# Patient Record
Sex: Female | Born: 1960 | Hispanic: Yes | Marital: Married | Smoking: Never smoker
Health system: Southern US, Community
[De-identification: ages and names within clinical notes are randomized; demographics above are authoritative.]

## PROBLEM LIST (undated history)

## (undated) DIAGNOSIS — E785 Hyperlipidemia, unspecified: Secondary | ICD-10-CM

## (undated) DIAGNOSIS — I10 Essential (primary) hypertension: Secondary | ICD-10-CM

## (undated) HISTORY — DX: Essential (primary) hypertension: I10

## (undated) HISTORY — PX: TUBAL LIGATION: SHX77

---

## 2015-07-28 DIAGNOSIS — Z9119 Patient's noncompliance with other medical treatment and regimen: Secondary | ICD-10-CM | POA: Insufficient documentation

## 2015-07-28 DIAGNOSIS — I10 Essential (primary) hypertension: Secondary | ICD-10-CM | POA: Insufficient documentation

## 2015-07-28 DIAGNOSIS — Z91199 Patient's noncompliance with other medical treatment and regimen due to unspecified reason: Secondary | ICD-10-CM | POA: Insufficient documentation

## 2015-07-28 DIAGNOSIS — Z789 Other specified health status: Secondary | ICD-10-CM | POA: Insufficient documentation

## 2016-07-21 DIAGNOSIS — R928 Other abnormal and inconclusive findings on diagnostic imaging of breast: Secondary | ICD-10-CM | POA: Insufficient documentation

## 2016-11-03 ENCOUNTER — Ambulatory Visit (INDEPENDENT_AMBULATORY_CARE_PROVIDER_SITE_OTHER): Payer: Self-pay

## 2016-11-03 ENCOUNTER — Ambulatory Visit (INDEPENDENT_AMBULATORY_CARE_PROVIDER_SITE_OTHER): Payer: Self-pay | Admitting: Family Medicine

## 2016-11-03 VITALS — BP 122/72 | HR 89 | Temp 98.7°F | Resp 17 | Ht 68.5 in | Wt 169.0 lb

## 2016-11-03 DIAGNOSIS — M25562 Pain in left knee: Secondary | ICD-10-CM

## 2016-11-03 MED ORDER — PREDNISONE 20 MG PO TABS: ORAL_TABLET | ORAL | 0 refills | Status: DC

## 2016-11-03 MED ORDER — ACETAMINOPHEN-CODEINE #2 300-15 MG PO TABS: 1.0000 | ORAL_TABLET | Freq: Four times a day (QID) | ORAL | 0 refills | Status: DC | PRN

## 2016-11-03 MED ORDER — ACETAMINOPHEN-CODEINE #2 300-15 MG PO TABS
1.0000 | ORAL_TABLET | Freq: Four times a day (QID) | ORAL | 0 refills | Status: DC | PRN
Start: 2016-11-03 — End: 2017-12-04

## 2016-11-03 NOTE — Progress Notes (Signed)
Patient ID: Erin Boyle, female    DOB: January 05, 1961, 55 y.o.   MRN: [id number]  PCP: Pcp Not In System  Chief Complaint  Patient presents with  . Knee Pain    left 2 weeks pain     Subjective:   HPI 55 year old female presents for evaluation of knee pain, left time 2 weeks. She was walking and twisted her left knee. Knee has been painful for two weeks but yesterday she reports new onset swelling. Used Bengay as topical analgesics with minimal relief. Pain characterized as painful burning sensation. Pain is worst top of her knee and medial posterior region of knee. When standing bearing full weight,  the pain radiates down the front of her leg. Denies any prior knee injury or fall.  Social History   Social History  . Marital status: Married    Spouse name: N/A  . Number of children: N/A  . Years of education: N/A   Occupational History  . Not on file.   Social History Main Topics  . Smoking status: Never Smoker  . Smokeless tobacco: Never Used  . Alcohol use No  . Drug use: No  . Sexual activity: No   Other Topics Concern  . Not on file   Social History Narrative  . No narrative on file   No family history on file.  Review of Systems See HPI  There are no active problems to display for this patient.  Prior to Admission medications   Medication Sig Start Date End Date Taking? Authorizing Provider  lisinopril-hydrochlorothiazide (PRINZIDE,ZESTORETIC) 20-12.5 MG tablet Take 1 tablet by mouth daily.   Yes Historical Provider, MD    Objective:  Physical Exam  Constitutional: She is oriented to person, place, and time. She appears well-developed and well-nourished.  HENT:  Head: Normocephalic and atraumatic.  Eyes: Conjunctivae are normal. Pupils are equal, round, and reactive to light.  Neck: Normal range of motion. Neck supple.  Cardiovascular: Normal rate, regular rhythm, normal heart sounds and intact distal pulses.   Pulmonary/Chest: Effort normal and  breath sounds normal.  Musculoskeletal: She exhibits edema and tenderness.  Left knee. Limited flexion and extension. Anterior knee edematous without palpable effusion.  Neurological: She is alert and oriented to person, place, and time.  Skin: Skin is warm and dry.  Psychiatric: She has a normal mood and affect. Her behavior is normal. Judgment and thought content normal.      Dg Knee Complete 4 Views Left  Result Date: 11/03/2016 CLINICAL DATA:  Pain following twisting injury 2 weeks prior EXAM: LEFT KNEE - COMPLETE 4+ VIEW COMPARISON:  None. FINDINGS: Frontal, lateral, bilateral oblique views were obtained. There is mild soft tissue swelling. There is no fracture or dislocation. No joint effusion. Joint spaces appear unremarkable. No erosive change. IMPRESSION: Mild soft tissue swelling. No fracture or joint effusion. No appreciable arthropathy. Electronically Signed   By: Bretta BangWilliam  Woodruff III M.D.   On: 11/03/2016 12:09   Vitals:   11/03/16 1120  BP: 122/72  Pulse: 89  Resp: 17  Temp: 98.7 F (37.1 C)   Assessment & Plan:  1. Acute pain of left knee - DG Knee Complete 4 Views Left  Pain likely related to knee strain. X-ray confirmed absence of effusion or fracture.  Plan: -Take Prednisone 20 mg,  in mornings with breakfast as follows:  Take 3 pills for 3 days, Take 2 pills for 3 days, and Take 1 pill for 3 days.  Complete all medication. -  Acetaminophen (Tylenol) Codeine 300-15 mg, take 1 tablet every 6 hours for severe pain. -You may apply cold or warm packs to knee for pain as needed. -Return if pain worsens or does not improve.   Godfrey PickKimberly S. Tiburcio PeaHarris, MSN, FNP-C [facility]

## 2016-11-03 NOTE — Patient Instructions (Addendum)
Tome Prednisone 20 mg, en las maanas con el desayuno de la siguiente [place]: tome 3 pldoras durante [address] [place], tome 2 pldoras durante 3 [address]  [address] y tome 1 pldora durante 3 [address]  [address]. Completa todos los medicamentos. Tylenol con codena puede tomar 1 tableta cada 6 horas segn sea necesario para el dolor de rodilla. Puede aplicar compresas fras o calientes a la rodilla para aliviar el dolor segn sea necesario. Regrese si el dolor empeora o no mejora.    Take Prednisone 20 mg,  in mornings with breakfast as follows:  Take 3 pills for 3 days, Take 2 pills for 3 days, and Take 1 pill for 3 days.  Complete all medication.  Tylenol with Codeine may take 1 tablet every 6 hours as needed for knee pain.  You may apply cold or warm packs to knee for pain as needed.  Return if pain worsens or does not improve.  IF you received an x-ray today, you will receive an invoice from [facility]. Please contact [facility] at [phone] with questions or concerns regarding your invoice.   IF you received labwork today, you will receive an invoice from [facility]. Please contact [facility] at [phone] with questions or concerns regarding your invoice.   Our billing staff will not be able to assist you with questions regarding bills from these companies.  You will be contacted with the lab results as soon as they are available. The fastest way to get your results is to activate your My Chart account. Instructions are located on the last page of this paperwork. If you have not heard from us regarding the results in 2 weeks, please contact this office.     Knee Pain Knee pain is a very common symptom and can have many causes. Knee pain often goes away when you follow your health care provider's instructions for relieving pain and discomfort at home. However, knee pain can develop into a condition that needs treatment. Some conditions may include:  Arthritis caused by wear  and tear (osteoarthritis).  Arthritis caused by swelling and irritation (rheumatoid arthritis or gout).  A cyst or growth in your knee.  An infection in your knee joint.  An injury that will not heal.  Damage, swelling, or irritation of the tissues that support your knee (torn ligaments or tendinitis). If your knee pain continues, additional tests may be ordered to diagnose your condition. Tests may include X-rays or other imaging studies of your knee. You may also need to have fluid removed from your knee. Treatment for ongoing knee pain depends on the cause, but treatment may include:  Medicines to relieve pain or swelling.  Steroid injections in your knee.  Physical therapy.  Surgery. HOME CARE INSTRUCTIONS  Take medicines only as directed by your health care provider.  Rest your knee and keep it raised (elevated) while you are resting.  Do not do things that cause or worsen pain.  Avoid high-impact activities or exercises, such as running, jumping rope, or doing jumping jacks.  Apply ice to the knee area:  Put ice in a plastic bag.  Place a towel between your skin and the bag.  Leave the ice on for 20 minutes, 2-3 times a day.  Ask your health care provider if you should wear an elastic knee support.  Keep a pillow under your knee when you sleep.  Lose weight if you are overweight. Extra weight can put pressure on your knee.  Do not use any tobacco products,  including cigarettes, chewing tobacco, or electronic cigarettes. If you need help quitting, ask your health care provider. Smoking may slow the healing of any bone and joint problems that you may have. SEEK MEDICAL CARE IF:  Your knee pain continues, changes, or gets worse.  You have a fever along with knee pain.  Your knee buckles or locks up.  Your knee becomes more swollen. SEEK IMMEDIATE MEDICAL CARE IF:   Your knee joint feels hot to the touch.  You have chest pain or trouble breathing. This  information is not intended to replace advice given to you by your health care provider. Make sure you discuss any questions you have with your health care provider. Document Released: 09/07/2007 Document Revised: 12/01/2014 Document Reviewed: 06/26/2014 Elsevier Interactive Patient Education  2017 [employer]

## 2017-12-04 ENCOUNTER — Encounter: Payer: Self-pay | Admitting: Urgent Care

## 2017-12-04 ENCOUNTER — Ambulatory Visit (INDEPENDENT_AMBULATORY_CARE_PROVIDER_SITE_OTHER): Payer: Self-pay | Admitting: Urgent Care

## 2017-12-04 VITALS — BP 161/90 | HR 96 | Temp 98.4°F | Resp 18 | Ht 64.5 in | Wt 170.0 lb

## 2017-12-04 DIAGNOSIS — I10 Essential (primary) hypertension: Secondary | ICD-10-CM

## 2017-12-04 DIAGNOSIS — R062 Wheezing: Secondary | ICD-10-CM

## 2017-12-04 DIAGNOSIS — R0789 Other chest pain: Secondary | ICD-10-CM

## 2017-12-04 DIAGNOSIS — R05 Cough: Secondary | ICD-10-CM

## 2017-12-04 DIAGNOSIS — R0602 Shortness of breath: Secondary | ICD-10-CM

## 2017-12-04 DIAGNOSIS — R059 Cough, unspecified: Secondary | ICD-10-CM

## 2017-12-04 DIAGNOSIS — R030 Elevated blood-pressure reading, without diagnosis of hypertension: Secondary | ICD-10-CM

## 2017-12-04 MED ORDER — HYDROCHLOROTHIAZIDE 25 MG PO TABS: 25.0000 mg | ORAL_TABLET | Freq: Every day | ORAL | 3 refills | Status: DC

## 2017-12-04 MED ORDER — AMLODIPINE BESYLATE 5 MG PO TABS: 5.0000 mg | ORAL_TABLET | Freq: Every day | ORAL | 3 refills | Status: DC

## 2017-12-04 MED ORDER — BENZONATATE 100 MG PO CAPS: 100.0000 mg | ORAL_CAPSULE | Freq: Three times a day (TID) | ORAL | 0 refills | Status: DC | PRN

## 2017-12-04 MED ORDER — HYDROCODONE-HOMATROPINE 5-1.5 MG/5ML PO SYRP: 5.0000 mL | ORAL_SOLUTION | Freq: Every evening | ORAL | 0 refills | Status: DC | PRN

## 2017-12-04 NOTE — Patient Instructions (Addendum)
Para el dolor de garganta intente usar un t de miel. Use 3 cucharaditas de miel con jugo exprimido de [employer]. Coloque las piezas de [place] en 1/2 - 1 taza de agua y caliente sobre la estufa. Luego mezcle los ingredientes y repita cada 4 horas.     Tos en los adultos Cough, Adult La tos es un reflejo que despeja la garganta y las vas respiratorias. Toser ayuda a curar y [employer]. Es normal toser a Occupational psychologist, pero si la tos aparece junto con otros sntomas o si dura [employer], puede indicar una enfermedad que necesita [place]. La tos puede durar solo 2 o 3semanas (aguda) o ms de 8semanas (crnica). Cules son las causas? Por lo general, las causas de la tos son las siguientes:  Aspirar sustancias que [employer].  Una infeccin respiratoria de origen viral o bacteriano.  Alergias.  Asma.  Goteo posnasal.  Fumar.  cido que vuelve del estmago hacia el esfago (reflujo gastroesofgico ).  Ciertos medicamentos.  Enfermedades pulmonares crnicas, incluida la EPOC (o rara vez, cncer de pulmn).  Otras afecciones, por ejemplo, insuficiencia cardaca.  Siga estas indicaciones en su casa: Est atento a cualquier cambio en los sntomas. Tome estas medidas para Paramedic las molestias:  Tome los medicamentos solamente como se lo haya indicado el mdico. ? Si le recetaron un antibitico, tmelo como se lo haya indicado el mdico. No deje de tomar los antibiticos aunque comience a Actor. ? Hable con el mdico antes de tomar un medicamento para la tos (antitusivo).  Beba suficiente lquido para Photographer orina clara o de color amarillo plido.  Si el aire est seco, use un vaporizador o humidificador de niebla fra en la habitacin o en la casa para ayudar a aflojar las secreciones.  Evite todo lo que le provoque tos en el trabajo o en su casa.  Si la tos empeora por la noche, pruebe a dormir en posicin semierguida.  Evite el  humo del cigarrillo. Si fuma, deje de hacerlo. Si necesita ayuda para dejar de fumar, consulte al mdico.  Evite la cafena.  Evite el alcohol.  Descanse todo lo que sea necesario.  Comunquese con un mdico si:  Aparecen nuevos sntomas.  Tose y escupe pus.  La tos no mejora despus de 2 o 3semanas o empeora.  No puede controlar la tos con antitusivos y no puede dormir debido a Secretary/administrator.  Siente un dolor que empeora o que no puede controlar con medicamentos.  Tiene fiebre.  Baja de peso sin causa aparente.  Tiene transpiracin nocturna. Solicite ayuda de inmediato si:  Tose y [employer].  Tiene dificultad para respirar.  El Hershey Company late Haring rpido. Esta informacin no tiene Theme park manager el consejo del mdico. Asegrese de hacerle al mdico cualquier pregunta que tenga. Document Released: 06/18/2011 Document Revised: 02/12/2017 Document Reviewed: 01/17/2015 Elsevier Interactive Patient Education  2018 [employer].      Hipertensin Hypertension La hipertensin, conocida comnmente como presin arterial alta, se produce cuando la sangre bombea en las arterias con mucha fuerza. Las arterias son los vasos sanguneos que transportan la sangre desde el corazn al resto del cuerpo. La hipertensin hace que el corazn haga ms esfuerzo para Insurance account manager y Sears Holdings Corporation que las arterias se Armed forces training and education officer o Multimedia programmer. La hipertensin no tratada o no controlada puede causar infarto de miocardio, accidentes cerebrovasculares, enfermedad renal y otros problemas. Una lectura de la presin arterial consiste de un nmero ms alto  sobre un nmero ms bajo. En condiciones ideales, la presin arterial debe estar por debajo de 120/80. El primer nmero ("superior") es la presin sistlica. Es la medida de la presin de las arterias cuando el corazn late. El segundo nmero ("inferior") es la presin diastlica. Es la medida de la presin en las arterias cuando el corazn se  relaja. Cules son las causas? Se desconoce la causa de esta afeccin. Qu incrementa el riesgo? Algunos factores de riesgo de hipertensin estn bajo su control. Otros no. Factores que puede Omnicommodificar  Fumar.  Tener diabetes mellitus tipo 2, colesterol alto, o ambos.  No hacer la cantidad suficiente de actividad fsica o ejercicio.  Tener sobrepeso.  Consumir mucha grasa, azcar, caloras o sal (sodio) en su dieta.  Beber alcohol en exceso. Factores que son difciles o imposibles de modificar  Tener enfermedad renal crnica.  Tener antecedentes familiares de presin arterial alta.  La edad. Los riesgos aumentan con la edad.  La raza. El riesgo es mayor para las Statisticianpersonas afroamericanas.  El sexo. Antes de los 45aos, los hombres corren ms Goodyear Tireriesgo que las mujeres. Despus de los 65aos, las mujeres corren ms Lexmark Internationalriesgo que los hombres.  Tener apnea obstructiva del sueo.  El estrs. Cules son los signos o los sntomas? La presin arterial extremadamente alta (crisis hipertensiva) puede provocar:  Dolor de Turkmenistancabeza.  Ansiedad.  Falta de aire.  Hemorragia nasal.  Nuseas y vmitos.  Dolor de pecho intenso.  Una crisis de movimientos que no puede controlar (convulsiones).  Cmo se diagnostica? Esta afeccin se diagnostica midiendo su presin arterial mientras se encuentra sentado, con el brazo apoyado sobre una superficie. El brazalete del tensimetro debe colocarse directamente sobre la piel de la parte superior del brazo y al nivel de su corazn. Debe medirla al [facility] Health Plainviewmenos dos veces en el mismo brazo. Determinadas condiciones pueden causar una diferencia de presin arterial entre el brazo izquierdo y Aeronautical engineerel derecho. Ciertos factores pueden provocar que las lecturas de la presin arterial sean inferiores o superiores a lo normal (elevadas) por un perodo corto de tiempo:  Si su presin arterial es ms alta cuando se encuentra en el consultorio del mdico que cuando la mide  en su hogar, se denomina "hipertensin de bata blanca". La Harley-Davidsonmayora de las personas que tienen esta afeccin no deben ser Engelhard Corporationmedicadas.  Si su presin arterial es ms alta en el hogar que cuando se encuentra en el consultorio del mdico, se denomina "hipertensin enmascarada". La Harley-Davidsonmayora de las personas que tienen esta afeccin deben ser medicadas para Chief Operating Officercontrolar la presin arterial.  Si tiene una lecturas de presin arterial alta durante una visita o si tiene presin arterial normal con otros factores de riesgo:  Es posible que se le pida que regrese Bankerotro da para volver a Chief Operating Officercontrolar su presin arterial.  Se le puede pedir que se controle la presin arterial en su casa durante 1 semana o ms.  Si se le diagnostica hipertensin, es posible que se le realicen otros anlisis de sangre o estudios de diagnstico por imgenes para ayudar a su mdico a comprender su riesgo general de tener otras afecciones. Cmo se trata? Esta afeccin se trata haciendo cambios saludables en el estilo de vida, tales como ingerir alimentos saludables, realizar ms ejercicio y reducir el consumo de alcohol. El mdico puede recetarle medicamentos si los cambios en el estilo de vida no son suficientes para Museum/gallery curatorlograr controlar la presin arterial y si:  Su presin arterial sistlica est por encima de 130.  Su presin arterial diastlica est por encima de 80.  La presin arterial deseada puede variar en funcin de las enfermedades, la edad y otros factores personales. Siga estas instrucciones en su casa: Comida y bebida  Siga una dieta con alto contenido de fibras y [place], y con bajo contenido de sodio, International aid/development worker agregada y Neurosurgeon. Un ejemplo de plan alimenticio es la dieta DASH (Dietary Approaches to Stop Hypertension, Mtodos alimenticios para detener la hipertensin). Para alimentarse de esta manera: ? Coma mucha fruta y verdura fresca. Trate de que la mitad del plato de cada comida sea de frutas y verduras. ? Coma cereales  integrales, como pasta integral, arroz integral y pan integral. Llene aproximadamente un cuarto del plato con cereales integrales. ? Coma y beba productos lcteos con bajo contenido de grasa, como leche descremada o yogur bajo en grasas. ? Evite la ingesta de cortes de carne grasa, carne procesada o curada, y carne de ave con piel. Llene aproximadamente un cuarto del plato con protenas magras, como pescado, pollo sin piel, frijoles, huevos y tofu. ? Evite ingerir alimentos prehechos o procesados. En general, estos tienen mayor cantidad de sodio, azcar agregada y Steffanie Rainwater.  Reduzca su ingesta diaria de sodio. La mayora de las personas que tienen hipertensin deben comer menos de 1500 mg de sodio por [employer] Worldwide.  Limite el consumo de alcohol a no ms de 1 medida por da si es mujer y no est Orthoptist y a 2 medidas por da si es hombre. Una medida equivale a 12onzas de cerveza, 5onzas de vino o 1onzas de bebidas alcohlicas de alta graduacin. Estilo de vida  Trabaje con su mdico para mantener un peso saludable o Curator. Pregntele cual es su peso recomendado.  Realice al menos 30 minutos de ejercicio que haga que se acelere su corazn (ejercicio Magazine features editor) la DIRECTV de la [place]. Estas actividades pueden incluir caminar, nadar o andar en bicicleta.  Incluya ejercicios para fortalecer sus msculos (ejercicios de resistencia), como pilates o levantamiento de pesas, como parte de su rutina semanal de ejercicios. Intente realizar de este tipo de ejercicios al Kellogg a la [place].  No consuma ningn producto que contenga nicotina o tabaco, como cigarrillos y Administrator, Civil Service. Si necesita ayuda para dejar de fumar, consulte al mdico.  Contrlese la presin arterial en su casa segn las indicaciones del mdico.  Concurra a todas las visitas de control como se lo haya indicado el mdico. Esto es importante. Medicamentos  Baxter International de venta libre y  los recetados solamente como se lo haya indicado el mdico. Siga cuidadosamente las indicaciones. Los medicamentos para la presin arterial deben tomarse segn las indicaciones.  No omita las dosis de medicamentos para la presin arterial. Si lo hace, estar en riesgo de tener problemas y puede hacer que los medicamentos sean menos eficaces.  Pregntele a su mdico a qu efectos secundarios o reacciones a los Museum/gallery curator. Comunquese con un mdico si:  Piensa que tiene una reaccin a un medicamento que est tomando.  Tiene dolores de cabeza frecuentes (recurrentes).  Siente mareos.  Tiene hinchazn en los tobillos.  Tiene problemas de visin. Solicite ayuda de inmediato si:  Siente un dolor de cabeza intenso o confusin.  Siente debilidad inusual o adormecimiento.  Siente que va a desmayarse.  Siente un dolor intenso en el pecho o el abdomen.  Vomita repetidas veces.  Tiene dificultad para respirar. Resumen  La hipertensin se produce cuando la Sara Lee  bombea en las arterias con mucha fuerza. Si esta afeccin no se controla, podra correr riesgo de tener complicaciones graves.  La presin arterial deseada puede variar en funcin de las enfermedades, la edad y otros factores personales. Para la [employer] Resources, una presin arterial normal es menor que 120/80.  La hipertensin se trata con cambios en el estilo de vida, medicamentos o una combinacin de [place]. Los Danaher Corporation estilo de vida incluyen prdida de peso, ingerir alimentos sanos, seguir una dieta baja en sodio, hacer ms ejercicio y Glass blower/designer consumo de alcohol. Esta informacin no tiene Theme park manager el consejo del mdico. Asegrese de hacerle al mdico cualquier pregunta que tenga. Document Released: 11/10/2005 Document Revised: 10/22/2016 Document Reviewed: 10/22/2016 Elsevier Interactive Patient Education  2018 [employer]vin[employer].     IF you received an x-ray today, you will  receive an invoice from [facility]. Please contact [facility] at [phone] with questions or concerns regarding your invoice.   IF you received labwork today, you will receive an invoice from [facility]. Please contact [facility] at [phone] with questions or concerns regarding your invoice.   Our billing staff will not be able to assist you with questions regarding bills from these companies.  You will be contacted with the lab results as soon as they are available. The fastest way to get your results is to activate your My Chart account. Instructions are located on the last page of this paperwork. If you have not heard from [place] regarding the results in 2 weeks, please contact this office.

## 2017-12-04 NOTE — Progress Notes (Signed)
   MRN: [id number] DOB: 1961-07-31  Subjective:   Erin Boyle is a 57 y.o. female presenting for follow up on Hypertension. Currently managed with lis-HCTZ. Patient is checking blood pressure at home, generally 130's systolic when she takes her BP medications. Currently she is out of her medications. Avoids salt in diet, is not exercising. Reports 8 day history of productive cough that elicits chest pain, shob, wheezing, sore throat. However, she admits that she would like to change her lisinopril because her cough has been more chronic in nature. Denies dizziness, chronic headache, heart racing, palpitations, nausea, vomiting, abdominal pain, hematuria, lower leg swelling. Denies smoking cigarettes or drinking alcohol.   Erin Boyle has a current medication list which includes the following prescription(s): lisinopril-hydrochlorothiazide. Also has No Known Allergies.  Erin Boyle  has no past medical history on file. Also  has no past surgical history on file.  Objective:   Vitals: BP (!) 161/90   Pulse 96   Temp 98.4 F (36.9 C) (Oral)   Resp 18   Ht 5' 4.5" (1.638 m)   Wt 170 lb (77.1 kg)   SpO2 100%   BMI 28.73 kg/m   BP Readings from Last 3 Encounters:  12/04/17 (!) 161/90  11/03/16 122/72    Physical Exam  Constitutional: She is oriented to person, place, and time. She appears well-developed and well-nourished.  HENT:  TM's intact bilaterally, no effusions or erythema. Nasal turbinates pink, moist, nasal passages patent. No sinus tenderness. Oropharynx clear, mucous membranes moist.  Eyes: Right eye exhibits no discharge. Left eye exhibits no discharge.  Neck: Normal range of motion. Neck supple. No thyromegaly present.  Cardiovascular: Normal rate, regular rhythm and intact distal pulses. Exam reveals no gallop and no friction rub.  No murmur heard. Pulmonary/Chest: No respiratory distress. She has no wheezes. She has no rales.  Abdominal: Soft. Bowel sounds are normal. She  exhibits no distension and no mass. There is no tenderness.  Musculoskeletal: She exhibits no edema.  Lymphadenopathy:    She has no cervical adenopathy.  Neurological: She is alert and oriented to person, place, and time.  Skin: Skin is warm and dry. No rash noted. No erythema. No pallor.  Psychiatric: She has a normal mood and affect.   Assessment and Plan :   Essential hypertension - Plan: Comprehensive metabolic panel  Elevated blood pressure reading  Cough  Atypical chest pain  Shortness of breath  Wheezing   Will stop lisinopril. Maintain HCTZ but increase this to 25mg . Add amlodipine 5mg . Counseled patient on potential for adverse effects with medications prescribed today, patient verbalized understanding. Labs pending. Will start supportive care for viral URI. Return-to-clinic precautions discussed, patient verbalized understanding.   Wallis BambergMario Chauntae Hults, PA-C [facility] [phone] 12/04/2017  2:39 PM

## 2017-12-05 LAB — COMPREHENSIVE METABOLIC PANEL
ALT: 16 IU/L (ref 0–32)
AST: 16 IU/L (ref 0–40)
Albumin/Globulin Ratio: 1.2 (ref 1.2–2.2)
Albumin: 3.7 g/dL (ref 3.5–5.5)
Alkaline Phosphatase: 109 IU/L (ref 39–117)
BUN/Creatinine Ratio: 29 — ABNORMAL HIGH (ref 9–23)
BUN: 16 mg/dL (ref 6–24)
Bilirubin Total: <0.2 mg/dL (ref 0.0–1.2)
CO2: 22 mmol/L (ref 20–29)
Calcium: 8.9 mg/dL (ref 8.7–10.2)
Chloride: 103 mmol/L (ref 96–106)
Creatinine, Ser: 0.55 mg/dL — ABNORMAL LOW (ref 0.57–1.00)
GFR calc Af Amer: 122 mL/min/{1.73_m2} (ref >59)
GFR calc non Af Amer: 106 mL/min/{1.73_m2} (ref >59)
Globulin, Total: 3.2 g/dL (ref 1.5–4.5)
Glucose: 111 mg/dL — ABNORMAL HIGH (ref 65–99)
Potassium: 4.1 mmol/L (ref 3.5–5.2)
Sodium: 142 mmol/L (ref 134–144)
Total Protein: 6.9 g/dL (ref 6.0–8.5)

## 2017-12-10 ENCOUNTER — Encounter: Payer: Self-pay | Admitting: Urgent Care

## 2017-12-30 ENCOUNTER — Telehealth: Payer: Self-pay | Admitting: Urgent Care

## 2017-12-30 NOTE — Telephone Encounter (Signed)
Called and spoke with pt to remind them of their apt tomorrow. Reminded pt of 15 minutes early, building number and 10 minute late policy. °

## 2017-12-31 ENCOUNTER — Ambulatory Visit (INDEPENDENT_AMBULATORY_CARE_PROVIDER_SITE_OTHER): Payer: Self-pay | Admitting: Urgent Care

## 2017-12-31 VITALS — BP 133/83 | HR 92 | Temp 98.3°F | Resp 16 | Ht 64.5 in | Wt 171.8 lb

## 2017-12-31 DIAGNOSIS — I10 Essential (primary) hypertension: Secondary | ICD-10-CM

## 2017-12-31 DIAGNOSIS — R05 Cough: Secondary | ICD-10-CM

## 2017-12-31 DIAGNOSIS — R030 Elevated blood-pressure reading, without diagnosis of hypertension: Secondary | ICD-10-CM

## 2017-12-31 DIAGNOSIS — R059 Cough, unspecified: Secondary | ICD-10-CM

## 2017-12-31 DIAGNOSIS — R0789 Other chest pain: Secondary | ICD-10-CM

## 2017-12-31 MED ORDER — BENZONATATE 100 MG PO CAPS: 100.0000 mg | ORAL_CAPSULE | Freq: Three times a day (TID) | ORAL | 0 refills | Status: DC | PRN

## 2017-12-31 MED ORDER — HYDROCODONE-HOMATROPINE 5-1.5 MG/5ML PO SYRP: 5.0000 mL | ORAL_SOLUTION | Freq: Every evening | ORAL | 0 refills | Status: DC | PRN

## 2017-12-31 NOTE — Patient Instructions (Addendum)
Tos en los adultos Cough, Adult La tos es un reflejo que despeja la garganta y las vas respiratorias. Toser ayuda a curar y [employer]. Es normal toser a Occupational psychologist, pero si la tos aparece junto con otros sntomas o si dura [employer], puede indicar una enfermedad que necesita [place]. La tos puede durar solo 2 o 3semanas (aguda) o ms de 8semanas (crnica). Cules son las causas? Por lo general, las causas de la tos son las siguientes:  Aspirar sustancias que [employer].  Una infeccin respiratoria de origen viral o bacteriano.  Alergias.  Asma.  Goteo posnasal.  Fumar.  cido que vuelve del estmago hacia el esfago (reflujo gastroesofgico ).  Ciertos medicamentos.  Enfermedades pulmonares crnicas, incluida la EPOC (o rara vez, cncer de pulmn).  Otras afecciones, por ejemplo, insuficiencia cardaca.  Siga estas indicaciones en su casa: Est atento a cualquier cambio en los sntomas. Tome estas medidas para Paramedic las molestias:  Tome los medicamentos solamente como se lo haya indicado el mdico. ? Si le recetaron un antibitico, tmelo como se lo haya indicado el mdico. No deje de tomar los antibiticos aunque comience a Actor. ? Hable con el mdico antes de tomar un medicamento para la tos (antitusivo).  Beba suficiente lquido para Photographer orina clara o de color amarillo plido.  Si el aire est seco, use un vaporizador o humidificador de niebla fra en la habitacin o en la casa para ayudar a aflojar las secreciones.  Evite todo lo que le provoque tos en el trabajo o en su casa.  Si la tos empeora por la noche, pruebe a dormir en posicin semierguida.  Evite el humo del cigarrillo. Si fuma, deje de hacerlo. Si necesita ayuda para dejar de fumar, consulte al mdico.  Evite la cafena.  Evite el alcohol.  Descanse todo lo que sea necesario.  Comunquese con un mdico si:  Aparecen nuevos sntomas.  Tose y escupe  pus.  La tos no mejora despus de 2 o 3semanas o empeora.  No puede controlar la tos con antitusivos y no puede dormir debido a Secretary/administrator.  Siente un dolor que empeora o que no puede controlar con medicamentos.  Tiene fiebre.  Baja de peso sin causa aparente.  Tiene transpiracin nocturna. Solicite ayuda de inmediato si:  Tose y [employer].  Tiene dificultad para respirar.  [employer] late Odanah rpido. Esta informacin no tiene Theme park manager el consejo del mdico. Asegrese de hacerle al mdico cualquier pregunta que tenga. Document Released: 06/18/2011 Document Revised: 02/12/2017 Document Reviewed: 01/17/2015 Elsevier Interactive Patient Education  2018 Elsevier Inc.     Cmo controlar su hipertensin Managing Your Hypertension La hipertensin se denomina usualmente presin arterial alta. Ocurre cuando la sangre presiona contra las paredes de las arterias con demasiada fuerza. Las arterias son los vasos sanguneos que transportan la sangre desde el corazn hacia todas las partes del cuerpo. La hipertensin hace que el corazn haga ms esfuerzo para Insurance account manager y [employer] que las arterias se Armed forces training and education officer o Multimedia programmer. La hipertensin no tratada o no controlada puede causar infarto de miocardio, accidente cerebrovascular, enfermedad renal y otros problemas. Qu son las Merchandiser, retail de presin arterial? Una lectura de la presin arterial consiste de un nmero ms alto sobre un nmero ms bajo. En condiciones ideales, la presin arterial debe estar por debajo de 120/80. El primer nmero ("superior") es la presin sistlica. Es la medida de la presin de las arterias cuando el corazn late.  El segundo nmero ("inferior") es la presin diastlica. Es la medida de la presin en las arterias cuando el corazn se relaja. Qu significa mi lectura de presin arterial? La presin arterial se clasifica en cuatro etapas. Sobre la base de la lectura de su presin arterial, el mdico  puede usar las siguientes etapas para determinar si necesita tratamiento y de qu tipo. La presin sistlica y la presin diastlica se miden en una unidad llamada mm Hg. Normal  Presin sistlica: por debajo de 120.  Presin diastlica: por debajo de 80. Elevada  Presin sistlica: 120-129.  Presin diastlica: por debajo de 80. Etapa 1 de hipertensin  Presin sistlica: 130-139.  Presin diastlica: 80-89. Etapa 2 de hipertensin  Presin sistlica: 140 o ms.  Presin diastlica: 90 o ms. Cules son los riesgos para la salud asociados con la hipertensin? Controlar la hipertensin es una responsabilidad importante. La hipertensin no controlada puede causar:  Infarto de miocardio.  Accidente cerebrovascular.  Debilitamiento de los vasos sanguneos (aneurisma).  Insuficiencia cardaca.  Dao renal.  Dao ocular.  Sndrome metablico.  Problemas de memoria y concentracin.  Qu cambios puedo hacer para controlar mi hipertensin? La hipertensin se puede controlar haciendo Danaher Corporationcambios en el estilo de vida y, posiblemente, tomando medicamentos. Su mdico le ayudar a crear un plan para bajar la presin arterial al rango normal. Comida y bebida  Siga una dieta con alto contenido de fibras y [place], y con bajo contenido de sal (sodio), azcar agregada y Rosalin Hawkinggrasas. Un ejemplo de plan alimenticio es la dieta DASH (Dietary Approaches to Stop Hypertension, Mtodos alimenticios para detener la hipertensin). Para alimentarse de esta manera: ? Coma mucha fruta y verdura fresca. Trate de que la mitad del plato de cada comida sea de frutas y verduras. ? Coma cereales integrales, como pasta integral, arroz integral y pan integral. Llene aproximadamente un cuarto del plato con cereales integrales. ? Consuma productos lcteos con bajo contenido de grasa. ? Evite la ingesta de cortes de carne grasa, carne procesada o curada, y carne de ave con piel. Llene aproximadamente un cuarto del  plato con protenas magras, como pescado, pollo sin piel, frijoles, huevos y tofu. ? Evite ingerir alimentos prehechos o procesados. En general, estos tienen mayor cantidad de sodio, azcar agregada y Steffanie Rainwatergrasa.  Reduzca su ingesta diaria de sodio. La mayora de las personas que tienen hipertensin deben comer menos de 1500 mg de sodio por C.H. Robinson Worldwideda.  Limite el consumo de alcohol a no ms de 1 medida por da si es mujer y no est Orthoptistembarazada y a 2 medidas por da si es hombre. Una medida equivale a 12onzas de cerveza, 5onzas de vino o 1onzas de bebidas alcohlicas de alta graduacin. Estilo de vida  Trabaje con su mdico para mantener un peso saludable o Curatorperder peso. Pregntele cual es su peso recomendado.  Realice al menos 30 minutos de ejercicio que haga que se acelere su corazn (ejercicio Magazine features editoraerbico) la DIRECTVmayora de los das de la [place]mana. Estas actividades pueden incluir caminar, nadar o andar en bicicleta.  Incluya ejercicios para fortalecer sus msculos (ejercicios de resistencia), como levantamiento de pesas, como parte de su rutina semanal de ejercicios. Intente realizar 30minutos de este tipo de ejercicios al Kelloggmenos tres das a la [place]mana.  No consuma ningn producto que contenga nicotina o tabaco, como cigarrillos y Administrator, Civil Servicecigarrillos electrnicos. Si necesita ayuda para dejar de fumar, consulte al American Expressmdico.  Controle las enfermedades a largo plazo (crnicas), como el colesterol alto o la diabetes. Control  Contrlese  la presin arterial en su casa segn las indicaciones del mdico. La presin arterial deseada puede variar en funcin de las enfermedades, la edad y otros factores personales.  Contrlese la presin arterial de manera regular, en la frecuencia indicada por su mdico. Trabaje con su mdico  Revise con su mdico todos los medicamentos que toma ya que puede haber efectos secundarios o interacciones.  Hable con su mdico acerca de la dieta, hbitos de ejercicio y otros factores del estilo de  vida que pueden contribuir a la hipertensin.  Consulte a su mdico regularmente. Su mdico puede ayudarle a crear y [place] su plan para controlar la hipertensin. Debo tomar un medicamento para controlar mi presin arterial? El mdico puede recetarle medicamentos si los cambios en el estilo de vida no son suficientes para Museum/gallery curator la presin arterial y si:  Su presin arterial sistlica es de 130 o ms.  Su presin arterial diastlica es de 80 o ms.  Tome los medicamentos solamente como se lo haya indicado el mdico. Siga cuidadosamente las indicaciones. Los medicamentos para la presin arterial deben tomarse segn las indicaciones. Los medicamentos pierden eficacia al omitir las dosis. El hecho de omitir las dosis tambin [place] el riesgo de otros problemas. Comunquese con un mdico si:  Piensa que tiene Runner, broadcasting/film/video a los medicamentos que ha tomado.  Tiene dolores de cabeza frecuentes (recurrentes).  Siente mareos.  Tiene hinchazn en los tobillos.  Tiene problemas de visin. Solicite ayuda de inmediato si:  Siente un dolor de cabeza intenso o confusin.  Siente debilidad inusual, adormecimiento o que Hospital doctor.  Siente un dolor intenso en el pecho o el abdomen.  Vomita repetidas veces.  Tiene dificultad para respirar. Resumen  La hipertensin se produce cuando la sangre bombea en las arterias con mucha fuerza. Si esta afeccin no se controla, podra correr riesgo de tener complicaciones graves.  La presin arterial deseada puede variar en funcin de las enfermedades, la edad y otros factores personales. Para la [employer], una presin arterial normal es menor que 120/80.  La hipertensin se puede controlar mediante cambios en el estilo de vida, tomando medicamentos, o ambas cosas. Los Dana[employer] Corporation estilo de vida incluyen prdida de peso, ingerir alimentos sanos, seguir una dieta baja en sodio, hacer ms ejercicio y Glass blower/designer consumo  de alcohol. Esta informacin no tiene Theme park manager el consejo del mdico. Asegrese de hacerle al mdico cualquier pregunta que tenga. Document Released: 08/04/2012 Document Revised: 10/22/2016 Document Reviewed: 10/22/2016 Elsevier Interactive Patient Education  2018 [employer].     IF you received an x-ray today, you will receive an invoice from [facility]. Please contact [facility] at [phone] with questions or concerns regarding your invoice.   IF you received labwork today, you will receive an invoice from [facility]. Please contact [facility] at [phone] with questions or concerns regarding your invoice.   Our billing staff will not be able to assist you with questions regarding bills from these companies.  You will be contacted with the lab results as soon as they are available. The fastest way to get your results is to activate your My Chart account. Instructions are located on the last page of this paperwork. If you have not heard from [place] regarding the results in 2 weeks, please contact this office.

## 2017-12-31 NOTE — Progress Notes (Signed)
   MRN: [id number] DOB: [birth year]  Subjective:   Erin Boyle is a 57 y.o. female presenting for follow up on Hypertension. At her last OV, she was taken off of lisinopril for a cough. She still has a cough and is better but still elicits mild chest pain. Currently managed with amlodipine, HCTZ. Patient is checking blood pressure at home, generally 140's systolic. She is not actively avoiding salt in her diet. Denies dizziness, shortness of breath, heart racing, palpitations, nausea, vomiting, abdominal pain, hematuria, lower leg swelling. Denies smoking cigarettes or drinking alcohol.   Erin Boyle has a current medication list which includes the following prescription(s): amlodipine and hydrochlorothiazide. Also has No Known Allergies.  Erin Boyle  has a past medical history of Hypertension. Also  has a past surgical history that includes Tubal ligation.  Objective:   Vitals: BP 133/83   Pulse 92   Temp 98.3 F (36.8 C) (Oral)   Resp 16   Ht 5' 4.5" (1.638 m)   Wt 171 lb 12.8 oz (77.9 kg)   SpO2 96%   BMI 29.03 kg/m   BP Readings from Last 3 Encounters:  12/31/17 133/83  12/04/17 (!) 161/90  11/03/16 122/72    Physical Exam  Constitutional: She is oriented to person, place, and time. She appears well-developed and well-nourished.  HENT:  Mouth/Throat: Oropharynx is clear and moist.  Cardiovascular: Normal rate, regular rhythm and intact distal pulses. Exam reveals no gallop and no friction rub.  No murmur heard. Pulmonary/Chest: No respiratory distress. She has no wheezes. She has no rales.  Neurological: She is alert and oriented to person, place, and time.  Skin: Skin is warm and dry.  Psychiatric: She has a normal mood and affect.   Assessment and Plan :   Essential hypertension  Elevated blood pressure reading  Cough  Atypical chest pain  Physical exam findings reassuring. Patient prefers to try cough medications, will hold off on doing chest x-ray. Maintain current  regimen for HTN. Recheck if cough persists, consider x-ray, steroid course and/or antibiotic course if this is the case.  Erin BambergMario Lyrica Mcclarty, PA-C [facility] [phone] 12/31/2017  11:54 AM

## 2018-01-15 ENCOUNTER — Ambulatory Visit: Payer: Self-pay | Admitting: Urgent Care

## 2018-01-16 ENCOUNTER — Other Ambulatory Visit: Payer: Self-pay

## 2018-01-16 ENCOUNTER — Ambulatory Visit (INDEPENDENT_AMBULATORY_CARE_PROVIDER_SITE_OTHER): Payer: Self-pay | Admitting: Urgent Care

## 2018-01-16 ENCOUNTER — Encounter: Payer: Self-pay | Admitting: Urgent Care

## 2018-01-16 VITALS — BP 146/86 | HR 84 | Temp 98.4°F | Resp 18 | Ht 60.39 in | Wt 171.0 lb

## 2018-01-16 DIAGNOSIS — R059 Cough, unspecified: Secondary | ICD-10-CM

## 2018-01-16 DIAGNOSIS — R030 Elevated blood-pressure reading, without diagnosis of hypertension: Secondary | ICD-10-CM

## 2018-01-16 DIAGNOSIS — R0789 Other chest pain: Secondary | ICD-10-CM

## 2018-01-16 DIAGNOSIS — I10 Essential (primary) hypertension: Secondary | ICD-10-CM

## 2018-01-16 DIAGNOSIS — R05 Cough: Secondary | ICD-10-CM

## 2018-01-16 NOTE — Patient Instructions (Signed)
     IF you received an x-ray today, you will receive an invoice from [facility]. Please contact [facility] at [phone] with questions or concerns regarding your invoice.   IF you received labwork today, you will receive an invoice from [facility]. Please contact [facility] at [phone] with questions or concerns regarding your invoice.   Our billing staff will not be able to assist you with questions regarding bills from these companies.  You will be contacted with the lab results as soon as they are available. The fastest way to get your results is to activate your My Chart account. Instructions are located on the last page of this paperwork. If you have not heard from us regarding the results in 2 weeks, please contact this office.     

## 2018-01-16 NOTE — Progress Notes (Signed)
    MRN: [id number] DOB: 1961/10/02  Subjective:   Erin Boyle is a 57 y.o. female presenting for follow up on cough. Reports that cough is improved, chest pain resolved. Denies fever, sore throat, shob, wheezing, n/v, abdominal pain. Denies smoking cigarettes or drinking alcohol.   [name] has a current medication list which includes the following prescription(s): amlodipine, hydrochlorothiazide, benzonatate, and hydrocodone-homatropine. Also has No Known Allergies.  Erin Boyle  has a past medical history of Hypertension. Also  has a past surgical history that includes Tubal ligation.  Objective:   Vitals: BP (!) 146/86 (BP Location: Left Arm, Patient Position: Sitting, Cuff Size: Normal)   Pulse 84   Temp 98.4 F (36.9 C) (Oral)   Resp 18   Ht 5' 0.39" (1.534 m)   Wt 171 lb (77.6 kg)   SpO2 97%   BMI 32.96 kg/m   Physical Exam  Constitutional: She is oriented to person, place, and time. She appears well-developed and well-nourished.  Cardiovascular: Normal rate, regular rhythm and intact distal pulses. Exam reveals no gallop and no friction rub.  No murmur heard. Pulmonary/Chest: No respiratory distress. She has no wheezes. She has no rales.  Neurological: She is alert and oriented to person, place, and time.   Assessment and Plan :   Cough  Atypical chest pain  Essential hypertension  Elevated blood pressure reading  Improved cough, chest pain resolved. Return-to-clinic precautions discussed, patient verbalized understanding. Maintain healthy lifestyle, BP medications. Follow up in 6 months.   Erin BambergMario Mikeya Tomasetti, PA-C [facility] at [facility] [phone] 01/16/2018  2:29 PM

## 2018-12-26 ENCOUNTER — Other Ambulatory Visit: Payer: Self-pay | Admitting: Urgent Care

## 2018-12-27 ENCOUNTER — Other Ambulatory Visit: Payer: Self-pay | Admitting: *Deleted

## 2018-12-27 ENCOUNTER — Telehealth: Payer: Self-pay | Admitting: *Deleted

## 2018-12-27 NOTE — Telephone Encounter (Signed)
Per Spain[place] interpreter patient is aware appointment has been scheduled for Sat. 01/01/2019 @  9:00 am. Pt advised that medication given enough until appointment.

## 2019-01-01 ENCOUNTER — Ambulatory Visit (INDEPENDENT_AMBULATORY_CARE_PROVIDER_SITE_OTHER): Payer: Self-pay | Admitting: Family Medicine

## 2019-01-01 ENCOUNTER — Other Ambulatory Visit: Payer: Self-pay

## 2019-01-01 ENCOUNTER — Encounter: Payer: Self-pay | Admitting: Family Medicine

## 2019-01-01 VITALS — BP 139/79 | HR 83 | Temp 97.7°F | Resp 14 | Ht 60.0 in | Wt 170.0 lb

## 2019-01-01 DIAGNOSIS — Z1211 Encounter for screening for malignant neoplasm of colon: Secondary | ICD-10-CM

## 2019-01-01 DIAGNOSIS — I10 Essential (primary) hypertension: Secondary | ICD-10-CM

## 2019-01-01 DIAGNOSIS — Z6833 Body mass index (BMI) 33.0-33.9, adult: Secondary | ICD-10-CM

## 2019-01-01 DIAGNOSIS — Z23 Encounter for immunization: Secondary | ICD-10-CM

## 2019-01-01 MED ORDER — HYDROCHLOROTHIAZIDE 25 MG PO TABS: 25.0000 mg | ORAL_TABLET | Freq: Every day | ORAL | 1 refills | Status: DC

## 2019-01-01 MED ORDER — AMLODIPINE BESYLATE 5 MG PO TABS: 5.0000 mg | ORAL_TABLET | Freq: Every day | ORAL | 1 refills | Status: DC

## 2019-01-01 NOTE — Progress Notes (Signed)
2/8/20209:24 AM  Erin CunasEsmeris Kuwahara July 08, 1961, 58 y.o. female [id number]  Chief Complaint  Patient presents with  . Establish Care    [facility] need refill on blood pressure medication that i have already pend.    HPI:   Patient is a 58 y.o. female with past medical history significant for HTN  who presents today to establish care  Previous PCP Urban GibsonMani, PA-C Last OV Sept 2019  Overall doing well Reports last pap and mammogram about 5 years ago Denies fhx colon, breast, ovarian cancer Does not have insurance  Lab Results  Component Value Date   CREATININE 0.55 (L) 12/04/2017   Lab Results  Component Value Date   NA 142 12/04/2017   K 4.1 12/04/2017   CL 103 12/04/2017   CO2 22 12/04/2017    Fall Risk  01/01/2019 01/16/2018  Falls in the past year? 0 No  Number falls in past yr: 0 -  Injury with Fall? 0 -  Follow up Falls evaluation completed -     [facility] 01/01/2019 01/16/2018 12/31/2017  Decreased Interest 0 0 0  Down, Depressed, Hopeless 0 0 0  PHQ - 2 Score 0 0 0    No Known Allergies  Prior to Admission medications   Medication Sig Start Date End Date Taking? Authorizing Provider  amLODipine (NORVASC) 5 MG tablet TAKE 1 TABLET BY MOUTH ONCE DAILY 12/27/18  Yes Myles LippsSantiago, Malia Corsi M, MD  hydrochlorothiazide (HYDRODIURIL) 25 MG tablet TAKE 1 TABLET BY MOUTH ONCE DAILY 12/27/18  Yes Myles LippsSantiago, Gabriella Woodhead M, MD    Past Medical History:  Diagnosis Date  . Hypertension     Past Surgical History:  Procedure Laterality Date  . TUBAL LIGATION      Social History   Tobacco Use  . Smoking status: Never Smoker  . Smokeless tobacco: Never Used  Substance Use Topics  . Alcohol use: No    Family History  Problem Relation Age of Onset  . Hypertension Mother   . Heart disease Mother   . Diabetes Sister   . Hyperlipidemia Sister   . Heart disease Sister   . Hypertension Sister   . Heart disease Maternal Aunt     Review of Systems    Constitutional: Negative for chills and fever.  Respiratory: Negative for cough and shortness of breath.   Cardiovascular: Negative for chest pain, palpitations and leg swelling.  Gastrointestinal: Negative for abdominal pain, blood in stool, melena, nausea and vomiting.     OBJECTIVE:  Blood pressure 139/79, pulse 83, temperature 97.7 F (36.5 C), temperature source Oral, resp. rate 14, height 5' (1.524 m), weight 170 lb (77.1 kg), SpO2 97 %. Body mass index is 33.2 kg/m.   Wt Readings from Last 3 Encounters:  01/01/19 170 lb (77.1 kg)  01/16/18 171 lb (77.6 kg)  12/31/17 171 lb 12.8 oz (77.9 kg)    Physical Exam Vitals signs and nursing note reviewed.  Constitutional:      Appearance: She is well-developed.  HENT:     Head: Normocephalic and atraumatic.     Mouth/Throat:     Pharynx: No oropharyngeal exudate.  Eyes:     General: No scleral icterus.    Conjunctiva/sclera: Conjunctivae normal.     Pupils: Pupils are equal, round, and reactive to light.  Neck:     Musculoskeletal: Neck supple.  Cardiovascular:     Rate and Rhythm: Normal rate and regular rhythm.     Heart sounds: Normal heart  sounds. No murmur. No friction rub. No gallop.   Pulmonary:     Effort: Pulmonary effort is normal.     Breath sounds: Normal breath sounds. No wheezing or rales.  Skin:    General: Skin is warm and dry.  Neurological:     Mental Status: She is alert and oriented to person, place, and time.     ASSESSMENT and PLAN  1. Essential hypertension, benign Controlled. Continue current regime.  - amLODipine (NORVASC) 5 MG tablet; Take 1 tablet (5 mg total) by mouth daily. - hydrochlorothiazide (HYDRODIURIL) 25 MG tablet; Take 1 tablet (25 mg total) by mouth daily. - Comprehensive metabolic panel - Lipid Panel  2. Need for prophylactic vaccination and inoculation against influenza - Flu Vaccine QUAD 6+ mos PF IM (Fluarix Quad PF)  3. Colon cancer screening - IFOBT POC (occult  bld, rslt in office); Future  4. BMI 33.0-33.9,adult Discussed importance of healthy diet, regular exercise and healthy weight.     Provided contact info for [facility] program  Return in about 6 months (around 07/02/2019).    Myles Lipps, MD Primary Care at [facility] [address] [place], [place] [address] Ph.  [phone] Fax [phone]

## 2019-01-01 NOTE — Patient Instructions (Addendum)
PROGRAMA PARA SU PAPANICOLAO Y MAMOGRAMA Breast and Cervical Cancer Program for [facility]: 1. [place], Fonnie Mu, Coordinator, [phone] 2. [place] [facility], Orlie Pollen, Cordinator, [phone]    Prevencin de las complicaciones causadas por conductas poco saludables para bajar de peso en los adultos Preventing Complications From Unhealthy Weight Loss Behaviors, Adult Personnel officer y Pharmacologist un peso saludable es importante para su salud general. Es natural querer bajar de peso enseguida, utilizando el mtodo que parezca ms rpido. Sin embargo, Publishing copy de peso de [place] saludable no es un proceso rpido. En su lugar, pngase como meta bajar de peso despacio y de [place]. Qu cambios en el estilo de vida se pueden realizar? Puede hacer ciertos cambios en su estilo de vida que lo ayudarn a Publishing copy de peso de [place] saludable. Entre [employer], se incluyen comer alimentos nutritivos y Radio producer ejercicio con regularidad. Lo que se debe evitar:  Seguir una dieta que prohba clases enteras de alimentos.  Saltearse comidas para Oncologist. Desayunar es particularmente importante.  No comer nada durante perodos prolongados (ayunar).  Limitar las caloras a mucho menos de la cantidad que necesita para Publishing copy de peso o Pharmacologist un peso saludable.  Hacer ejercicio extremadamente intenso de manera compulsiva.  Tomar laxantes para defecar con mayor frecuencia.  Tomar medicamentos para hacer que el cuerpo elimine el exceso de lquido (diurticos).  Consumir una cantidad excesiva de alimentos (atracn compulsivo) y luego provocarse el vmito (purga). Conductas saludables:   Consuma una amplia variedad de alimentos saludables, por ejemplo: ? Frutas y verduras. ? Cereales integrales. ? Protenas magras. ? Productos lcteos descremados.  Beba agua en lugar de bebidas azucaradas.  Planifique comidas saludables, bajas en caloras. Trabaje con un especialista  en nutricin (nutricionista) para elaborar un plan de comidas saludables que sea adecuado para usted y Programme researcher, broadcasting/film/video un programa de ejercicios. ? Incluya diferentes tipos de ejercicios en el programa, por ejemplo, ejercicios de fuerza, aerbicos y de flexibilidad. ? Para Pitney Bowes, haga por lo menos de ejercicio de intensidad moderada por semana. El ejercicio de intensidad moderada podra incluir caminar enrgicamente o andar en bicicleta. ? Para bajar de peso de [place] saludable, haga de ejercicio de intensidad moderada por da.  Encuentre maneras de reducir el estrs, por ejemplo, hacer ejercicio o meditar con regularidad.  Encuentre un pasatiempo u otra actividad que disfrute para distraerse y Facilities manager cuando se sienta estresado o aburrido. Por qu son importantes estos cambios? Hacer estos cambios lo ayudar a Scientist, clinical (histocompatibility and immunogenetics) su salud general. Adems, mantener un peso saludable reduce el riesgo de sufrir ciertas afecciones, por ejemplo:  Enfermedades cardacas.  Colesterol elevado.  Hipertensin arterial.  Diabetes tipo 2.  Accidente cerebrovascular.  Artrosis.  Osteoporosis.  Algunos tipos de cncer.  Trastornos del sueo y de Investment banker, operational. Qu puede suceder si no se hacen cambios? Tener una alimentacin desordenada o recurrir a otras conductas alimentarias poco saludables en un intento por bajar de peso puede causar lo siguiente:  Fatiga.  Desequilibrios en los electrolitos y en las sustancias qumicas que el cuerpo necesita para funcionar bien.  Dao Teachers Insurance and Annuity Association rganos o insuficiencia orgnica, especialmente de los riones.  Deshidratacin.  Desequilibrios en los lquidos corporales.  Frecuencia cardaca y presin arterial bajas.  Huesos frgiles que se fracturan con facilidad.  Aislamiento social o problemas para relacionarse con amigos y familiares.  Malestar psquico, que incluye depresin y [place].  Mayor riesgo de tener un trastorno  de la Air cabin crew. Si tiene un  trastorno de la NIKE, podra sufrir problemas de salud y Advice worker graves que afectan los rganos y los procesos corporales. Estos incluyen los siguientes:  Sequedad de la piel y del cabello.  Cada del cabello.  Desmayos.  Dificultad para quedar embarazada.  Cambios en el msculo cardaco y en la manera en que funciona el corazn.  Deshidratacin grave.  Dao en el tubo digestivo y [place].  Problemas gastrointestinales a Air cabin crew (crnicos).  Hipertensin arterial.  Colesterol elevado.  Enfermedades cardacas.  Diabetes tipo 2. Dnde encontrar apoyo Para recibir ms apoyo, hable con las siguientes personas:  Su mdico o nutricionista. Pregunte por grupos de apoyo.  Un profesional de salud mental.  Familiares y amigos. Dnde encontrar ms informacin [place] ms acerca de cmo prevenir las complicaciones causadas por conductas poco saludables para bajar de Walt Disney siguientes sitios:  Centros para Air traffic controller y Psychiatrist de Child psychotherapist for Disease Control and Prevention, CDC): https://harper.com/  Training and development officer de Salud Mental ([employer] Mills of Mental Health): InsuranceStats.ca.shtml  Asociacin Nacional contra los Trastornos de la Conducta Alimentaria (National Eating Disorders Association): www.nationaleatingdisorders.org Comunquese con un mdico si:  Se siente muy cansado con frecuencia.  Se nota cambios en la piel o en el cabello.  Se desmaya debido a la deshidratacin o porque hizo demasiado ejercicio.  Tiene dificultades para cambiar sus conductas poco saludables para bajar de peso sin [place].  Las conductas poco saludables para Publishing copy de peso afectan su vida cotidiana.  Presenta signos o sntomas de un trastorno de la NIKE.  Tiene cambios  importantes en el peso en [place].  Comer o Recruitment consultant provoca sentimientos de culpa o vergenza.  Tiene dificultades en sus relaciones debido a sus hbitos para bajar de peso. Resumen  Pngase como meta bajar de peso despacio y de [place] constante, eligiendo alimentos saludables, consumiendo suficientes caloras por da y haciendo ejercicio con regularidad.  Si no puede hacer estos cambios por su cuenta, o si cree que podra tener un trastorno de la [place], comunquese con su mdico. Esta informacin no tiene Theme park manager el consejo del mdico. Asegrese de hacerle al mdico cualquier pregunta que tenga. Document Released: 02/18/2017 Document Revised: 02/18/2017 Document Reviewed: 11/25/2015 Elsevier Interactive Patient Education  2019 Arvin[employer]itor.  Pruebas de deteccin de Building services engineer Colorectal Cancer Screening  Las pruebas de deteccin de cncer colorrectal se usan para confirmar o descartar este tipo de cncer antes de que se desarrollen los sntomas. Colorrectal se refiere al colon y al recto. El colon y el recto se encuentran al final del tubo digestivo y all se eliminan las deposiciones hacia fuera del cuerpo. Quin debe realizarse la prueba de deteccin? Safeco Corporation adultos a Glass blower/designer de los 50 aos Lubrizol Corporation 75 aos deben hacerse pruebas de Airline pilot. El mdico puede recomendarle las pruebas de deteccin a partir de los 45 aos. Le realizarn pruebas cada 1 a 10 aos, segn los [place] y el tipo de prueba de Airline pilot. Puede realizarse pruebas de deteccin a partir de una edad ms temprana o con ms frecuencia que Nucor Corporation, si usted tiene alguno de los siguientes factores de riesgo:  Antecedentes personales o familiares de cncer colorrectal o bultos anormales (plipos).  Una enfermedad inflamatoria del intestino, como colitis ulcerosa o enfermedad de Crohn.  Antecedentes de radioterapia en el abdomen o rea plvica para tratamiento  de cncer.  Sntomas de Building services engineer, como cambios en los hbitos intestinales o sangre en las  heces.  Un tipo de sndrome de cncer de colon que se transmite de padres a hijos (hereditario), como: ? Sndrome de Personnel officerLynch. ? Poliposis adenomatosa familiar. ? Sndrome de [place]. ? Sndrome de Peutz-Jeghers. Las recomendaciones de pruebas de Airline pilotdeteccin para los adultos que tienen entre 75 y 3385 aos de edad varan segn la salud. Cmo se realiza este estudio? Hay diversos tipos de pruebas de Doctor, general practicedeteccin de cncer colorrectal. Pueden hacerle una o ms de las siguientes:  Prueba de sangre oculta en la materia fecal con guayacol. Para realizar esta prueba, se examina una muestra de heces (materia fecal) a fin de Oceanographerdetectar sangre oculta (escondida), lo que podra ser un signo de Building services engineercncer colorrectal.  Prueba inmunoqumica fecal (PIF). Para realizar esta prueba, se examina Lauris Poaguna muestra de heces a fin de detectar la presencia de Prosperitysangre, lo que podra ser un signo de Building services engineercncer colorrectal.  Prueba de cido desoxirribonucleico (ADN) en heces. Para realizar esta prueba, se examina Lauris Poaguna muestra de heces a fin de Landscape architectdetectar la presencia de sangre y cambios en el ADN que podran Facilities managerprovocar cncer colorrectal.  Sigmoidoscopa. Durante esta prueba, se utiliza un tubo flexible y delgado con una cmara en el extremo (sigmoidoscopio) para examinar el recto y la parte inferior del colon.  Colonoscopa. Durante esta prueba, se utiliza un tubo largo y flexible con una cmara en el extremo (colonoscopio) para examinar todo el colon y el recto. Mediante una colonoscopa es posible tomar una muestra de tejido (biopsia) y extirpar pequeos plipos durante la prueba.  Colonoscopa virtual. En lugar de un colonoscopio, en este tipo de colonoscopa se utilizan radiografas (exploracin por tomografa computarizada (TC)) y computadoras para generar imgenes del colon y el recto. Cules son los beneficios de las pruebas de  deteccin? Las pruebas de deteccin reducen el riesgo de cncer colorrectal y pueden ayudar a identificar el cncer en una etapa temprana, cuando se puede extirpar o tratar con mayor facilidad. Es comn que se formen plipos en el revestimiento del colon, especialmente a medida que envejece. Estos plipos pueden ser cancerosos o volverse cancerosos con el [place] Fellstiempo. Las pruebas de deteccin pueden identificar estos plipos. Cules son los riesgos de la prueba de deteccin? Cada prueba de deteccin puede Energy Transfer Partnerstener diferentes riesgos.  Las pruebas de muestras de heces tienen menos riesgos que otros tipos de pruebas de Airline pilotdeteccin. Sin embargo, es posible que deba realizarse ms pruebas para Texas Instrumentsconfirmar los resultados de [place]a prueba de Luxembourgmuestra de Timber Lakeheces.  Las pruebas de deteccin con radiografas lo exponen a niveles bajos de radiacin, lo que puede aumentar ligeramente el riesgo de Database administratorcncer. El beneficio de Architectural technologistdetectar el cncer supera el ligero aumento del Mount Aetnariesgo.  Las pruebas de Designer, industrial/productdeteccin como la sigmoidoscopa y Engineer, agriculturalla colonoscopa pueden conllevar riesgo de sangrado, dao intestinal, infeccin o una reaccin a los medicamentos administrados durante el examen. Consulte a su mdico para comprender su riesgo de Public relations account executivetener cncer colorrectal y para elaborar un plan de deteccin que sea adecuado para usted. Preguntas para hacerle al mdico  Cundo debo comenzar con las pruebas de deteccin del cncer colorrectal?  Cul es mi riesgo de Warehouse managertener cncer colorrectal?  Con qu frecuencia tengo que realizarme pruebas de deteccin?  Qu pruebas de deteccin tengo que realizarme?  Cmo obtengo los resultados de la prueba?  Qu significan los resultados? Dnde buscar ms informacin Obtenga ms informacin sobre las pruebas de deteccin del Building services engineercncer colorrectal en los siguientes sitios:  Sociedad Education officer, communityAmericana contra el Cncer (American Cancer Society): www.cancer.org  The Krogernstituto Nacional del Cncer (  National Southwest AirlinesCancer Institute):  www.cancer.gov Resumen  Las pruebas de deteccin de Building services engineercncer colorrectal se usan para Astronomerconfirmar o Engineer, agriculturaldescartar este tipo de cncer antes de que se desarrollen los sntomas.  Las pruebas de deteccin reducen el riesgo de cncer colorrectal y pueden ayudar a identificar el cncer en una etapa temprana, cuando se puede extirpar o tratar con mayor facilidad.  Safeco Corporationodos los adultos a Glass blower/designerpartir de los 50 aos Lubrizol Corporationhasta los 75 aos deben hacerse pruebas de Airline pilotdeteccin. El mdico puede recomendarle las pruebas de deteccin a partir de los 45 aos.  Puede realizarse pruebas de deteccin a partir de una edad ms temprana o con ms frecuencia que Nucor Corporationotras personas, si usted tiene determinados factores de Chief of Staffriesgo.  Consulte a su mdico para comprender su riesgo de Public relations account executivetener cncer colorrectal y para elaborar un plan de deteccin que sea adecuado para usted. Esta informacin no tiene Theme park managercomo fin reemplazar el consejo del mdico. Asegrese de hacerle al mdico cualquier pregunta que tenga. Document Released: 08/04/2012 Document Revised: 12/16/2017 Document Reviewed: 10/09/2017 Elsevier Interactive Patient Education  [employer]on [employer]2019 Elsevier Inc.   If you have lab work done today you will be contacted with your lab results within the next 2 weeks.  If you have not heard from us then please contact us. The fastest way to get your results is to register for My Chart.   IF you received an x-ray today, you will receive an invoice from [facility]. Please contact [facility] at [phone] with questions or concerns regarding your invoice.   IF you received labwork today, you will receive an invoice from [facility]. Please contact [facility] at [phone] with questions or concerns regarding your invoice.   Our billing staff will not be able to assist you with questions regarding bills from these companies.  You will be contacted with the lab results as soon as they are available. The fastest way to get your results is to activate  your My Chart account. Instructions are located on the last page of this paperwork. If you have not heard from us regarding the results in 2 weeks, please contact this office.

## 2019-01-02 LAB — COMPREHENSIVE METABOLIC PANEL
ALT: 20 IU/L (ref 0–32)
AST: 17 IU/L (ref 0–40)
Albumin/Globulin Ratio: 1.3 (ref 1.2–2.2)
Albumin: 4.1 g/dL (ref 3.8–4.9)
Alkaline Phosphatase: 125 IU/L — ABNORMAL HIGH (ref 39–117)
BUN/Creatinine Ratio: 16 (ref 9–23)
BUN: 10 mg/dL (ref 6–24)
Bilirubin Total: 0.3 mg/dL (ref 0.0–1.2)
CO2: 19 mmol/L — ABNORMAL LOW (ref 20–29)
Calcium: 9.3 mg/dL (ref 8.7–10.2)
Chloride: 103 mmol/L (ref 96–106)
Creatinine, Ser: 0.63 mg/dL (ref 0.57–1.00)
GFR calc Af Amer: 115 mL/min/{1.73_m2} (ref >59)
GFR calc non Af Amer: 100 mL/min/{1.73_m2} (ref >59)
Globulin, Total: 3.1 g/dL (ref 1.5–4.5)
Glucose: 93 mg/dL (ref 65–99)
Potassium: 4 mmol/L (ref 3.5–5.2)
Sodium: 141 mmol/L (ref 134–144)
Total Protein: 7.2 g/dL (ref 6.0–8.5)

## 2019-01-02 LAB — LIPID PANEL
Chol/HDL Ratio: 4.1 ratio (ref 0.0–4.4)
Cholesterol, Total: 233 mg/dL — ABNORMAL HIGH (ref 100–199)
HDL: 57 mg/dL (ref >39)
LDL Calculated: 151 mg/dL — ABNORMAL HIGH (ref 0–99)
Triglycerides: 124 mg/dL (ref 0–149)
VLDL Cholesterol Cal: 25 mg/dL (ref 5–40)

## 2019-01-14 ENCOUNTER — Encounter: Payer: Self-pay | Admitting: Radiology

## 2019-07-07 ENCOUNTER — Ambulatory Visit: Payer: Self-pay | Admitting: Family Medicine

## 2019-07-08 ENCOUNTER — Encounter: Payer: Self-pay | Admitting: Family Medicine

## 2019-09-19 ENCOUNTER — Encounter: Payer: Self-pay | Admitting: Family Medicine

## 2019-09-19 ENCOUNTER — Other Ambulatory Visit: Payer: Self-pay

## 2019-09-19 ENCOUNTER — Other Ambulatory Visit (HOSPITAL_COMMUNITY)
Admission: RE | Admit: 2019-09-19 | Discharge: 2019-09-19 | Disposition: A | Discharge status: Home / Self Care | Payer: Self-pay | Source: Ambulatory Visit | Attending: Family Medicine | Admitting: Family Medicine

## 2019-09-19 ENCOUNTER — Ambulatory Visit (INDEPENDENT_AMBULATORY_CARE_PROVIDER_SITE_OTHER): Payer: Self-pay | Admitting: Family Medicine

## 2019-09-19 VITALS — BP 152/78 | HR 75 | Temp 97.9°F | Ht 61.0 in | Wt 175.4 lb

## 2019-09-19 DIAGNOSIS — E7800 Pure hypercholesterolemia, unspecified: Secondary | ICD-10-CM

## 2019-09-19 DIAGNOSIS — Z1231 Encounter for screening mammogram for malignant neoplasm of breast: Secondary | ICD-10-CM

## 2019-09-19 DIAGNOSIS — E876 Hypokalemia: Secondary | ICD-10-CM

## 2019-09-19 DIAGNOSIS — Z124 Encounter for screening for malignant neoplasm of cervix: Secondary | ICD-10-CM | POA: Insufficient documentation

## 2019-09-19 DIAGNOSIS — Z23 Encounter for immunization: Secondary | ICD-10-CM

## 2019-09-19 DIAGNOSIS — I10 Essential (primary) hypertension: Secondary | ICD-10-CM

## 2019-09-19 MED ORDER — AMLODIPINE BESYLATE 5 MG PO TABS: 5.0000 mg | ORAL_TABLET | Freq: Every day | ORAL | 1 refills | Status: DC

## 2019-09-19 MED ORDER — HYDROCHLOROTHIAZIDE 25 MG PO TABS: 25.0000 mg | ORAL_TABLET | Freq: Every day | ORAL | 1 refills | Status: DC

## 2019-09-19 NOTE — Patient Instructions (Addendum)
  Breast and Cervical Cancer Program for [place]: 1. [place], Christine Brannock, Coordinator, [phone] 2. [place] [facility], Kim Lookabill, Cordinator, [phone]   If you have lab work done today you will be contacted with your lab results within the next 2 weeks.  If you have not heard from us then please contact us. The fastest way to get your results is to register for My Chart.   IF you received an x-ray today, you will receive an invoice from [facility]. Please contact [facility] at [phone] with questions or concerns regarding your invoice.   IF you received labwork today, you will receive an invoice from [facility]. Please contact [facility] at [phone] with questions or concerns regarding your invoice.   Our billing staff will not be able to assist you with questions regarding bills from these companies.  You will be contacted with the lab results as soon as they are available. The fastest way to get your results is to activate your My Chart account. Instructions are located on the last page of this paperwork. If you have not heard from us regarding the results in 2 weeks, please contact this office.     

## 2019-09-19 NOTE — Progress Notes (Signed)
10/26/20202:52 PM  Erin Boyle [birth year], 58 y.o., female [id number]  Chief Complaint  Patient presents with  . Hypertension  . Medication Refill    HPI:   Patient is a 58 y.o. female with past medical history significant for HTN and HLP who presents today for routine followup  Last OV Feb 2020 She never got to see [facility] program due to covid, they only scheduled her for mammogram which she missed  Last mammo at [facility] 2017 - birads 3 No pap on file, last pap over 5 years ago Denies h/o abnormal paps postmenopausal  Has not taken her BP meds wo 3 days Has not been working on her diet  BP Readings from Last 3 Encounters:  09/19/19 (!) 152/78  01/01/19 139/79  01/16/18 (!) 146/86    Wt Readings from Last 3 Encounters:  09/19/19 175 lb 6.4 oz (79.6 kg)  01/01/19 170 lb (77.1 kg)  01/16/18 171 lb (77.6 kg)    Depression screen [facility] 2/9 09/19/2019 01/01/2019 01/16/2018  Decreased Interest 0 0 0  Down, Depressed, Hopeless 0 0 0  PHQ - 2 Score 0 0 0    Fall Risk  09/19/2019 01/01/2019 01/16/2018  Falls in the past year? 0 0 No  Number falls in past yr: 0 0 -  Injury with Fall? 0 0 -  Follow up Falls evaluation completed Falls evaluation completed -     No Known Allergies  Prior to Admission medications   Medication Sig Start Date End Date Taking? Authorizing Provider  amLODipine (NORVASC) 5 MG tablet Take 1 tablet (5 mg total) by mouth daily. 01/01/19  Yes Myles Lipps, MD  hydrochlorothiazide (HYDRODIURIL) 25 MG tablet Take 1 tablet (25 mg total) by mouth daily. 01/01/19  Yes Myles Lipps, MD    Past Medical History:  Diagnosis Date  . Hypertension     Past Surgical History:  Procedure Laterality Date  . TUBAL LIGATION      Social History   Tobacco Use  . Smoking status: Never Smoker  . Smokeless tobacco: Never Used  Substance Use Topics  . Alcohol use: No    Family History  Problem Relation Age of Onset  . Hypertension Mother   . Heart  disease Mother   . Diabetes Sister   . Hyperlipidemia Sister   . Heart disease Sister   . Hypertension Sister   . Heart disease Maternal Aunt     Review of Systems  Constitutional: Negative for chills and fever.  Respiratory: Negative for cough and shortness of breath.   Cardiovascular: Negative for chest pain, palpitations and leg swelling.  Gastrointestinal: Negative for abdominal pain, nausea and vomiting.     OBJECTIVE:  Today's Vitals   09/19/19 1417 09/19/19 1419  BP: (!) 154/77 (!) 152/78  Pulse: 75   Temp: 97.9 F (36.6 C)   TempSrc: Oral   SpO2: 98%   Weight: 175 lb 6.4 oz (79.6 kg)   Height: 5\' 1"  (1.549 m)    Body mass index is 33.14 kg/m.   Physical Exam Vitals signs and nursing note reviewed. Exam conducted with a chaperone present.  Constitutional:      Appearance: She is well-developed.  HENT:     Head: Normocephalic and atraumatic.     Right Ear: Hearing, tympanic membrane, ear canal and external ear normal.     Left Ear: Hearing, tympanic membrane, ear canal and external ear normal.  Eyes:     Conjunctiva/sclera: Conjunctivae normal.  Pupils: Pupils are equal, round, and reactive to light.  Neck:     Musculoskeletal: Neck supple.     Thyroid: No thyromegaly.  Cardiovascular:     Rate and Rhythm: Normal rate and regular rhythm.     Pulses: Normal pulses.     Heart sounds: Normal heart sounds. No murmur. No friction rub. No gallop.   Pulmonary:     Effort: Pulmonary effort is normal.     Breath sounds: Normal breath sounds. No wheezing, rhonchi or rales.  Chest:     Breasts: Breasts are symmetrical.        Right: No inverted nipple, mass, nipple discharge, skin change or tenderness.        Left: No inverted nipple, mass, nipple discharge, skin change or tenderness.  Abdominal:     General: Bowel sounds are normal. There is no distension.     Palpations: Abdomen is soft. There is no hepatomegaly, splenomegaly or mass.     Tenderness: There  is no abdominal tenderness. There is no guarding.     Hernia: No hernia is present.  Genitourinary:    Labia:        Right: No rash or lesion.        Left: No rash or lesion.      Vagina: No vaginal discharge or erythema.     Cervix: No cervical motion tenderness, discharge or friability.     Uterus: Not enlarged, not fixed and not tender.      Adnexa:        Right: No mass or tenderness.         Left: No mass or tenderness.    Musculoskeletal: Normal range of motion.     Right lower leg: No edema.     Left lower leg: No edema.  Lymphadenopathy:     Cervical: No cervical adenopathy.     Upper Body:     Right upper body: No supraclavicular, axillary or pectoral adenopathy.     Left upper body: No supraclavicular, axillary or pectoral adenopathy.  Skin:    General: Skin is warm and dry.  Neurological:     General: No focal deficit present.     Mental Status: She is alert and oriented to person, place, and time.     Cranial Nerves: No cranial nerve deficit.     Gait: Gait normal.     Deep Tendon Reflexes: Reflexes are normal and symmetric.  Psychiatric:        Mood and Affect: Mood normal.        Behavior: Behavior normal.     No results found for this or any previous visit (from the past 24 hour(s)).  No results found.   ASSESSMENT and PLAN  1. Essential hypertension, benign Uncontrolled in setting of being off meds. Previous BP at goal. Restart meds.  - Comprehensive metabolic panel - amLODipine (NORVASC) 5 MG tablet; Take 1 tablet (5 mg total) by mouth daily. - hydrochlorothiazide (HYDRODIURIL) 25 MG tablet; Take 1 tablet (25 mg total) by mouth daily.  2. Pure hypercholesterolemia Discussed LFM. Labs pending, consider statin - Lipid panel  3. Visit for screening mammogram Normal CBE. referreing to [facility] for mammogram - Ambulatory Referral to [facility]CP  4. Cervical cancer screening Pap done today  5. Need for immunization against influenza  Other orders - Flu  Vaccine QUAD 36+ mos IM  Return in about 6 months (around 03/19/2020).    Rutherford Guys, MD [facility]  [address] [place], [place] [address] Ph.  [phone] Fax [phone]   

## 2019-09-20 ENCOUNTER — Other Ambulatory Visit: Payer: Self-pay | Admitting: Family Medicine

## 2019-09-20 LAB — COMPREHENSIVE METABOLIC PANEL
ALT: 23 IU/L (ref 0–32)
AST: 21 IU/L (ref 0–40)
Albumin/Globulin Ratio: 1.4 (ref 1.2–2.2)
Albumin: 4.3 g/dL (ref 3.8–4.9)
Alkaline Phosphatase: 138 IU/L — ABNORMAL HIGH (ref 39–117)
BUN/Creatinine Ratio: 23 (ref 9–23)
BUN: 13 mg/dL (ref 6–24)
Bilirubin Total: 0.2 mg/dL (ref 0.0–1.2)
CO2: 23 mmol/L (ref 20–29)
Calcium: 9.1 mg/dL (ref 8.7–10.2)
Chloride: 99 mmol/L (ref 96–106)
Creatinine, Ser: 0.57 mg/dL (ref 0.57–1.00)
GFR calc Af Amer: 119 mL/min/{1.73_m2} (ref >59)
GFR calc non Af Amer: 103 mL/min/{1.73_m2} (ref >59)
Globulin, Total: 3 g/dL (ref 1.5–4.5)
Glucose: 86 mg/dL (ref 65–99)
Potassium: 3.2 mmol/L — ABNORMAL LOW (ref 3.5–5.2)
Sodium: 141 mmol/L (ref 134–144)
Total Protein: 7.3 g/dL (ref 6.0–8.5)

## 2019-09-20 LAB — LIPID PANEL
Chol/HDL Ratio: 4.9 ratio — ABNORMAL HIGH (ref 0.0–4.4)
Cholesterol, Total: 261 mg/dL — ABNORMAL HIGH (ref 100–199)
HDL: 53 mg/dL (ref >39)
LDL Chol Calc (NIH): 173 mg/dL — ABNORMAL HIGH (ref 0–99)
Triglycerides: 187 mg/dL — ABNORMAL HIGH (ref 0–149)
VLDL Cholesterol Cal: 35 mg/dL (ref 5–40)

## 2019-09-20 MED ORDER — ATORVASTATIN CALCIUM 40 MG PO TABS: 40.0000 mg | ORAL_TABLET | Freq: Every day | ORAL | 3 refills | Status: DC

## 2019-09-20 MED ORDER — TRIAMTERENE-HCTZ 37.5-25 MG PO TABS: 1.0000 | ORAL_TABLET | Freq: Every day | ORAL | 1 refills | Status: DC

## 2019-09-20 MED ORDER — POTASSIUM CHLORIDE ER 10 MEQ PO CPCR: 20.0000 meq | ORAL_CAPSULE | Freq: Two times a day (BID) | ORAL | 0 refills | Status: DC

## 2019-09-20 NOTE — Addendum Note (Signed)
Addended by: Rutherford Guys on: 09/20/2019 01:21 PM   Modules accepted: Orders

## 2019-10-10 ENCOUNTER — Other Ambulatory Visit (HOSPITAL_COMMUNITY): Payer: Self-pay | Admitting: *Deleted

## 2019-10-10 DIAGNOSIS — Z1231 Encounter for screening mammogram for malignant neoplasm of breast: Secondary | ICD-10-CM

## 2019-12-08 ENCOUNTER — Ambulatory Visit (HOSPITAL_COMMUNITY)
Admission: RE | Admit: 2019-12-08 | Discharge: 2019-12-08 | Disposition: A | Discharge status: Home / Self Care | Payer: Self-pay | Source: Ambulatory Visit | Attending: Obstetrics and Gynecology | Admitting: Obstetrics and Gynecology

## 2019-12-08 ENCOUNTER — Other Ambulatory Visit: Payer: Self-pay

## 2019-12-08 ENCOUNTER — Encounter (HOSPITAL_COMMUNITY): Payer: Self-pay

## 2019-12-08 ENCOUNTER — Ambulatory Visit
Admission: RE | Admit: 2019-12-08 | Discharge: 2019-12-08 | Disposition: A | Discharge status: Home / Self Care | Payer: No Typology Code available for payment source | Source: Ambulatory Visit | Attending: Obstetrics and Gynecology | Admitting: Obstetrics and Gynecology

## 2019-12-08 DIAGNOSIS — Z1231 Encounter for screening mammogram for malignant neoplasm of breast: Secondary | ICD-10-CM

## 2019-12-08 DIAGNOSIS — Z1239 Encounter for other screening for malignant neoplasm of breast: Secondary | ICD-10-CM | POA: Insufficient documentation

## 2019-12-08 HISTORY — DX: Hyperlipidemia, unspecified: E78.5

## 2019-12-08 NOTE — Progress Notes (Signed)
No complaints today.   Pap Smear: Pap smear not completed today. Last Pap smear was 09/19/2019 at [facility] and normal per patient. Per patient has no history of an abnormal Pap smear. No Pap smear results are in Epic.  Physical exam: Breasts Breasts symmetrical. No skin abnormalities left breast. Observed a bruise on the right breast upper inner quadrant that per patient hit something. No nipple retraction bilateral breasts. No nipple discharge bilateral breasts. No lymphadenopathy. No lumps palpated bilateral breasts. No complaints of pain or tenderness on exam. Referred patient to the [facility] for a screening mammogram. Appointment scheduled for Thursday, December 08, 2019 at 1250.        Pelvic/Bimanual No Pap smear completed today since last Pap smear was 09/19/2019. Pap smear not indicated per BCCCP guidelines.   Smoking History: Patient has never smoked.  Patient Navigation: Patient education provided. Access to services provided for patient through [facility] program. Spanish interpreter provided.   Colorectal Cancer Screening: Per patient has never had a colonoscopy completed. No complaints today.   Breast and Cervical Cancer Risk Assessment: Patient has a family history of two maternal aunts and her maternal grandmother having breast cancer. Patient has no known genetic mutations or history of radiation treatment to the chest before age 68. Patient has no history of cervical dysplasia, immunocompromised, or DES exposure in-utero.  Risk Assessment    Risk Scores      12/08/2019   Last edited by: Narda Rutherford, LPN   5-year risk: 0.6 %   Lifetime risk: 3.6 %         Used Spanish interpreter Natale Lay from [place].

## 2019-12-08 NOTE — Patient Instructions (Signed)
Explained breast self awareness with [employer]. Patient did not need a Pap smear today due to last Pap smear was 09/19/2019 per patient. Let her know [facility] will cover Pap smears every 3 years unless has a history of abnormal Pap smears. Referred patient to the [facility] for a screening mammogram. Appointment scheduled for Thursday, December 08, 2019 at 1250. Patient aware of appointment and will be there. Let patient know the [facility] Center will follow up with her within the next couple weeks with results of her mammogram by letter or phone. Emilynn Racca verbalized understanding.  Laurel Harnden, Kathaleen Maser, RN 10:20 AM

## 2020-03-19 ENCOUNTER — Ambulatory Visit (INDEPENDENT_AMBULATORY_CARE_PROVIDER_SITE_OTHER): Payer: Self-pay | Admitting: Family Medicine

## 2020-03-19 ENCOUNTER — Other Ambulatory Visit: Payer: Self-pay

## 2020-03-19 ENCOUNTER — Encounter: Payer: Self-pay | Admitting: Family Medicine

## 2020-03-19 DIAGNOSIS — I10 Essential (primary) hypertension: Secondary | ICD-10-CM

## 2020-03-19 MED ORDER — ATORVASTATIN CALCIUM 40 MG PO TABS: 40.0000 mg | ORAL_TABLET | Freq: Every day | ORAL | 3 refills | Status: DC

## 2020-03-19 MED ORDER — AMLODIPINE BESYLATE 10 MG PO TABS: 10.0000 mg | ORAL_TABLET | Freq: Every day | ORAL | 1 refills | Status: DC

## 2020-03-19 MED ORDER — TRIAMTERENE-HCTZ 37.5-25 MG PO TABS: 1.0000 | ORAL_TABLET | Freq: Every day | ORAL | 2 refills | Status: DC

## 2020-03-19 NOTE — Patient Instructions (Signed)
° ° ° °  If you have lab work done today you will be contacted with your lab results within the next 2 weeks.  If you have not heard from us then please contact us. The fastest way to get your results is to register for My Chart. ° ° °IF you received an x-ray today, you will receive an invoice from [facility]. Please contact [facility] at [phone] with questions or concerns regarding your invoice.  ° °IF you received labwork today, you will receive an invoice from [facility]. Please contact [facility] at [phone] with questions or concerns regarding your invoice.  ° °Our billing staff will not be able to assist you with questions regarding bills from these companies. ° °You will be contacted with the lab results as soon as they are available. The fastest way to get your results is to activate your My Chart account. Instructions are located on the last page of this paperwork. If you have not heard from us regarding the results in 2 weeks, please contact this office. °  ° ° ° °

## 2020-03-19 NOTE — Progress Notes (Signed)
4/26/20212:16 PM  Erin Boyle [birth year], 59 y.o., female [id number]  Chief Complaint  Patient presents with  . Hypertension  . Wheezing    HPI:   Patient is a 59 y.o. female with past medical history significant for HTN and HLP who presents today for routine followup  Last OV oct 2020 - changed to maxzide due to hypokalemia, started atorvastatin.   She continued taking HCTZ and maxzide plus amlodipine  Checks occ at [employer] - last time 2 days ago, SBP 147   Patient reports intermittent very short lived whistle sound when breathing Does not make her stop her activities No SOB, no chest tightness No h/o asthma Does not smoke  Lab Results  Component Value Date   CREATININE 0.57 09/19/2019   BUN 13 09/19/2019   NA 141 09/19/2019   K 3.2 (L) 09/19/2019   CL 99 09/19/2019   CO2 23 09/19/2019   Lab Results  Component Value Date   CHOL 261 (H) 09/19/2019   HDL 53 09/19/2019   LDLCALC 173 (H) 09/19/2019   TRIG 187 (H) 09/19/2019   CHOLHDL 4.9 (H) 09/19/2019     Depression screen PHQ 2/9 03/19/2020 09/19/2019 01/01/2019  Decreased Interest 0 0 0  Down, Depressed, Hopeless 0 0 0  PHQ - 2 Score 0 0 0    Fall Risk  03/19/2020 09/19/2019 01/01/2019 01/16/2018  Falls in the past year? 0 0 0 No  Number falls in past yr: 0 0 0 -  Injury with Fall? 0 0 0 -  Follow up Falls evaluation completed Falls evaluation completed Falls evaluation completed -     No Known Allergies  Prior to Admission medications   Medication Sig Start Date End Date Taking? Authorizing Provider  amLODipine (NORVASC) 5 MG tablet Take 1 tablet (5 mg total) by mouth daily. 09/19/19  Yes Rutherford Guys, MD  atorvastatin (LIPITOR) 40 MG tablet Take 1 tablet (40 mg total) by mouth daily. 09/20/19  Yes Rutherford Guys, MD  triamterene-hydrochlorothiazide (MAXZIDE-25) 37.5-25 MG tablet Take 1 tablet by mouth daily. 09/20/19  Yes Rutherford Guys, MD  potassium chloride (MICRO-K) 10 MEQ CR capsule Take  2 capsules (20 mEq total) by mouth 2 (two) times daily. Patient not taking: Reported on 12/08/2019 09/20/19   Rutherford Guys, MD    Past Medical History:  Diagnosis Date  . Hyperlipidemia   . Hypertension     Past Surgical History:  Procedure Laterality Date  . TUBAL LIGATION      Social History   Tobacco Use  . Smoking status: Never Smoker  . Smokeless tobacco: Never Used  Substance Use Topics  . Alcohol use: No    Family History  Problem Relation Age of Onset  . Hypertension Mother   . Heart disease Mother   . Diabetes Sister   . Heart disease Sister   . Hypertension Sister   . Heart disease Maternal Aunt   . Cancer Brother   . Prostate cancer Brother     Review of Systems  Constitutional: Negative for chills and fever.  Respiratory: Negative for cough and shortness of breath.   Cardiovascular: Negative for chest pain, palpitations and leg swelling.  Gastrointestinal: Negative for abdominal pain, nausea and vomiting.     OBJECTIVE:  Today's Vitals   03/19/20 1402  BP: (!) 152/90  Pulse: 86  Temp: 98 F (36.7 C)  SpO2: 98%  Weight: 178 lb (80.7 kg)  Height: 5\' 1"  (1.549 m)   Body  mass index is 33.63 kg/m.   Physical Exam Vitals and nursing note reviewed.  Constitutional:      Appearance: She is well-developed.  HENT:     Head: Normocephalic and atraumatic.     Mouth/Throat:     Pharynx: No oropharyngeal exudate.  Eyes:     General: No scleral icterus.    Conjunctiva/sclera: Conjunctivae normal.     Pupils: Pupils are equal, round, and reactive to light.  Cardiovascular:     Rate and Rhythm: Normal rate and regular rhythm.     Heart sounds: Normal heart sounds. No murmur. No friction rub. No gallop.   Pulmonary:     Effort: Pulmonary effort is normal.     Breath sounds: Normal breath sounds. No wheezing or rales.  Musculoskeletal:     Cervical back: Neck supple.  Skin:    General: Skin is warm and dry.  Neurological:     Mental  Status: She is alert and oriented to person, place, and time.     No results found for this or any previous visit (from the past 24 hour(s)).  No results found.   ASSESSMENT and PLAN  1. Essential hypertension, benign Not controlled. Discussed stop HCTZ as planned, increase amlodipine 10mg  daily - amLODipine (NORVASC) 10 MG tablet; Take 1 tablet (10 mg total) by mouth daily.  Other orders - atorvastatin (LIPITOR) 40 MG tablet; Take 1 tablet (40 mg total) by mouth daily. - triamterene-hydrochlorothiazide (MAXZIDE-25) 37.5-25 MG tablet; Take 1 tablet by mouth daily.  Return in about 4 weeks (around 04/16/2020) for HTN and labs.    04/18/2020, MD Primary Care at [facility] [address] [place], [place] Ph.  [phone] Fax [phone]

## 2020-04-16 ENCOUNTER — Ambulatory Visit (INDEPENDENT_AMBULATORY_CARE_PROVIDER_SITE_OTHER): Payer: Self-pay | Admitting: Family Medicine

## 2020-04-16 ENCOUNTER — Encounter: Payer: Self-pay | Admitting: Family Medicine

## 2020-04-16 ENCOUNTER — Other Ambulatory Visit: Payer: Self-pay

## 2020-04-16 VITALS — BP 132/84 | HR 95 | Temp 98.4°F | Ht 61.0 in | Wt 182.0 lb

## 2020-04-16 DIAGNOSIS — I10 Essential (primary) hypertension: Secondary | ICD-10-CM

## 2020-04-16 DIAGNOSIS — E7800 Pure hypercholesterolemia, unspecified: Secondary | ICD-10-CM

## 2020-04-16 MED ORDER — AMLODIPINE BESYLATE 10 MG PO TABS: 10.0000 mg | ORAL_TABLET | Freq: Every day | ORAL | 5 refills | Status: DC

## 2020-04-16 MED ORDER — TRIAMTERENE-HCTZ 75-50 MG PO TABS: 1.0000 | ORAL_TABLET | Freq: Every day | ORAL | 5 refills | Status: DC

## 2020-04-16 MED ORDER — ATORVASTATIN CALCIUM 40 MG PO TABS: 40.0000 mg | ORAL_TABLET | Freq: Every day | ORAL | 5 refills | Status: DC

## 2020-04-16 NOTE — Progress Notes (Signed)
5/24/20213:01 PM  Erin Boyle 1961/09/25, 59 y.o., female [id number]  Chief Complaint  Patient presents with  . Hypertension    f/u     HPI:   Patient is a 59 y.o. female with past medical history significant for HTN and HLP who presents today for routine followup  Last OV April 2021 - increased amlodipine to 10mg  daily  She reports she has been taking Amlodipine 10mg , maxzide 25mg  and HCTZ 25mg  (she did not stop HCTZ 25mg  as discussed) She is also taking atoravastatin 40mg  daily She checks her BP in [employer] SBP 127 She has been trying to work to her salt intake Reports muscle cramps resolved  Depression screen [facility] 2/9 03/19/2020 09/19/2019 01/01/2019  Decreased Interest 0 0 0  Down, Depressed, Hopeless 0 0 0  PHQ - 2 Score 0 0 0    Fall Risk  04/16/2020 03/19/2020 09/19/2019 01/01/2019 01/16/2018  Falls in the past year? 0 0 0 0 No  Number falls in past yr: 0 0 0 0 -  Injury with Fall? 0 0 0 0 -  Follow up Falls evaluation completed Falls evaluation completed Falls evaluation completed Falls evaluation completed -     No Known Allergies  Prior to Admission medications   Medication Sig Start Date End Date Taking? Authorizing Provider  amLODipine (NORVASC) 10 MG tablet Take 1 tablet (10 mg total) by mouth daily. 03/19/20  Yes 04/18/2020, MD  atorvastatin (LIPITOR) 40 MG tablet Take 1 tablet (40 mg total) by mouth daily. 03/19/20  Yes 09/21/2019, MD  hydrochlorothiazide (HYDRODIURIL) 25 MG tablet Take 25 mg by mouth daily.   Yes [provider]  triamterene-hydrochlorothiazide (MAXZIDE-25) 37.5-25 MG tablet Take 1 tablet by mouth daily. 03/19/20  Yes 03/21/20, MD    Past Medical History:  Diagnosis Date  . Hyperlipidemia   . Hypertension     Past Surgical History:  Procedure Laterality Date  . TUBAL LIGATION      Social History   Tobacco Use  . Smoking status: Never Smoker  . Smokeless tobacco: Never Used  Substance Use Topics  .  Alcohol use: No    Family History  Problem Relation Age of Onset  . Hypertension Mother   . Heart disease Mother   . Diabetes Sister   . Heart disease Sister   . Hypertension Sister   . Heart disease Maternal Aunt   . Cancer Brother   . Prostate cancer Brother     Review of Systems  Constitutional: Negative for chills and fever.  Respiratory: Negative for cough and shortness of breath.   Cardiovascular: Negative for chest pain, palpitations and leg swelling.  Gastrointestinal: Negative for abdominal pain, nausea and vomiting.     OBJECTIVE:  Today's Vitals   04/16/20 1448 04/16/20 1514  BP: (!) 150/77 132/84  Pulse: 95   Temp: 98.4 F (36.9 C)   TempSrc: Temporal   SpO2: 96%   Weight: 182 lb (82.6 kg)   Height: 5\' 1"  (1.549 m)    Body mass index is 34.39 kg/m.   Physical Exam Vitals and nursing note reviewed.  Constitutional:      Appearance: She is well-developed.  HENT:     Head: Normocephalic and atraumatic.     Mouth/Throat:     Pharynx: No oropharyngeal exudate.  Eyes:     General: No scleral icterus.    Conjunctiva/sclera: Conjunctivae normal.     Pupils: Pupils are equal, round, and reactive to light.  Cardiovascular:     Rate and Rhythm: Normal rate and regular rhythm.     Heart sounds: Normal heart sounds. No murmur. No friction rub. No gallop.   Pulmonary:     Effort: Pulmonary effort is normal.     Breath sounds: Normal breath sounds. No wheezing or rales.  Musculoskeletal:     Cervical back: Neck supple.     Right lower leg: No edema.     Left lower leg: No edema.  Skin:    General: Skin is warm and dry.  Neurological:     Mental Status: She is alert and oriented to person, place, and time.     No results found for this or any previous visit (from the past 24 hour(s)).  No results found.   ASSESSMENT and PLAN  1. Benign essential hypertension Controlled. Continue current regime.  - Comprehensive metabolic panel  2. Pure  hypercholesterolemia Checking labs today, medications will be adjusted as needed.  - Lipid panel   Other orders - amLODipine (NORVASC) 10 MG tablet; Take 1 tablet (10 mg total) by mouth daily. - triamterene-hydrochlorothiazide (MAXZIDE) 75-50 MG tablet; Take 1 tablet by mouth daily. - atorvastatin (LIPITOR) 40 MG tablet; Take 1 tablet (40 mg total) by mouth daily.  Return in about 6 months (around 10/17/2020).    Rutherford Guys, MD Primary Care at [facility] [place], [place] [address] Ph.  [phone] Fax [phone]

## 2020-04-16 NOTE — Patient Instructions (Signed)
° ° ° °  If you have lab work done today you will be contacted with your lab results within the next 2 weeks.  If you have not heard from us then please contact us. The fastest way to get your results is to register for My Chart. ° ° °IF you received an x-ray today, you will receive an invoice from [facility]. Please contact [facility] at [phone] with questions or concerns regarding your invoice.  ° °IF you received labwork today, you will receive an invoice from [facility]. Please contact [facility] at [phone] with questions or concerns regarding your invoice.  ° °Our billing staff will not be able to assist you with questions regarding bills from these companies. ° °You will be contacted with the lab results as soon as they are available. The fastest way to get your results is to activate your My Chart account. Instructions are located on the last page of this paperwork. If you have not heard from us regarding the results in 2 weeks, please contact this office. °  ° ° ° °

## 2020-04-17 LAB — COMPREHENSIVE METABOLIC PANEL
ALT: 19 IU/L (ref 0–32)
AST: 17 IU/L (ref 0–40)
Albumin/Globulin Ratio: 1.4 (ref 1.2–2.2)
Albumin: 4 g/dL (ref 3.8–4.9)
Alkaline Phosphatase: 125 IU/L — ABNORMAL HIGH (ref 48–121)
BUN/Creatinine Ratio: 31 — ABNORMAL HIGH (ref 9–23)
BUN: 18 mg/dL (ref 6–24)
Bilirubin Total: <0.2 mg/dL (ref 0.0–1.2)
CO2: 27 mmol/L (ref 20–29)
Calcium: 9.5 mg/dL (ref 8.7–10.2)
Chloride: 98 mmol/L (ref 96–106)
Creatinine, Ser: 0.58 mg/dL (ref 0.57–1.00)
GFR calc Af Amer: 117 mL/min/{1.73_m2} (ref >59)
GFR calc non Af Amer: 102 mL/min/{1.73_m2} (ref >59)
Globulin, Total: 2.9 g/dL (ref 1.5–4.5)
Glucose: 209 mg/dL — ABNORMAL HIGH (ref 65–99)
Potassium: 3.2 mmol/L — ABNORMAL LOW (ref 3.5–5.2)
Sodium: 137 mmol/L (ref 134–144)
Total Protein: 6.9 g/dL (ref 6.0–8.5)

## 2020-04-17 LAB — LIPID PANEL
Chol/HDL Ratio: 3.2 ratio (ref 0.0–4.4)
Cholesterol, Total: 146 mg/dL (ref 100–199)
HDL: 46 mg/dL (ref >39)
LDL Chol Calc (NIH): 73 mg/dL (ref 0–99)
Triglycerides: 154 mg/dL — ABNORMAL HIGH (ref 0–149)
VLDL Cholesterol Cal: 27 mg/dL (ref 5–40)

## 2020-04-27 ENCOUNTER — Other Ambulatory Visit: Payer: Self-pay | Admitting: Family Medicine

## 2020-04-27 DIAGNOSIS — E876 Hypokalemia: Secondary | ICD-10-CM

## 2020-04-27 MED ORDER — POTASSIUM CHLORIDE CRYS ER 20 MEQ PO TBCR
20.0000 meq | EXTENDED_RELEASE_TABLET | Freq: Every day | ORAL | 0 refills | Status: DC
Start: 2020-04-27 — End: 2021-03-18

## 2020-05-08 ENCOUNTER — Encounter: Payer: Self-pay | Admitting: Registered Nurse

## 2020-05-08 ENCOUNTER — Other Ambulatory Visit: Payer: Self-pay

## 2020-05-08 ENCOUNTER — Ambulatory Visit (INDEPENDENT_AMBULATORY_CARE_PROVIDER_SITE_OTHER): Payer: Self-pay | Admitting: Registered Nurse

## 2020-05-08 VITALS — BP 124/72 | HR 97 | Temp 98.3°F | Resp 18 | Ht 61.0 in | Wt 181.8 lb

## 2020-05-08 DIAGNOSIS — E876 Hypokalemia: Secondary | ICD-10-CM

## 2020-05-08 DIAGNOSIS — R2243 Localized swelling, mass and lump, lower limb, bilateral: Secondary | ICD-10-CM

## 2020-05-08 MED ORDER — SULFAMETHOXAZOLE-TRIMETHOPRIM 400-80 MG PO TABS: 1.0000 | ORAL_TABLET | Freq: Two times a day (BID) | ORAL | 0 refills | Status: DC

## 2020-05-08 NOTE — Patient Instructions (Signed)
° ° ° °  If you have lab work done today you will be contacted with your lab results within the next 2 weeks.  If you have not heard from us then please contact us. The fastest way to get your results is to register for My Chart. ° ° °IF you received an x-ray today, you will receive an invoice from [facility]. Please contact [facility] at [phone] with questions or concerns regarding your invoice.  ° °IF you received labwork today, you will receive an invoice from [facility]. Please contact [facility] at [phone] with questions or concerns regarding your invoice.  ° °Our billing staff will not be able to assist you with questions regarding bills from these companies. ° °You will be contacted with the lab results as soon as they are available. The fastest way to get your results is to activate your My Chart account. Instructions are located on the last page of this paperwork. If you have not heard from us regarding the results in 2 weeks, please contact this office. °  ° ° ° °

## 2020-05-09 LAB — BASIC METABOLIC PANEL
BUN/Creatinine Ratio: 28 — ABNORMAL HIGH (ref 9–23)
BUN: 17 mg/dL (ref 6–24)
CO2: 23 mmol/L (ref 20–29)
Calcium: 9.5 mg/dL (ref 8.7–10.2)
Chloride: 99 mmol/L (ref 96–106)
Creatinine, Ser: 0.6 mg/dL (ref 0.57–1.00)
GFR calc Af Amer: 116 mL/min/{1.73_m2} (ref >59)
GFR calc non Af Amer: 101 mL/min/{1.73_m2} (ref >59)
Glucose: 131 mg/dL — ABNORMAL HIGH (ref 65–99)
Potassium: 3.7 mmol/L (ref 3.5–5.2)
Sodium: 139 mmol/L (ref 134–144)

## 2020-05-09 NOTE — Progress Notes (Signed)
Established Patient Office Visit  Subjective:  Patient ID: Erin Boyle, female    DOB: [birth year]  Age: 59 y.o. MRN: [id number]  CC:  Chief Complaint  Patient presents with  . Leg Swelling    Patient states from her legs down to her feet has been swollen and painful. Patient has tried a OTC cream and ice and heat goes down for a little and comes back.    HPI Erin Boyle presents for acute swelling and redness of both lower legs  Onset around 1 week ago. Gradually worsening. Red, swollen, mildly tender, warm to touch. Has used OTC lotions and ice, which gives mild benefit, but symptoms quickly return No known insect bites, injuries, or break in skin integrity. No systemic symptoms of infection. No hx of DVT or provoking event to her knowledge.  Symptoms extent from about 2" above ankle to 2" below knee. R worse than L. No drainage. No streaking. Feet are asymptomatic. No joint pain in lower extremities.  This has not happened before.  Past Medical History:  Diagnosis Date  . Hyperlipidemia   . Hypertension     Past Surgical History:  Procedure Laterality Date  . TUBAL LIGATION      Family History  Problem Relation Age of Onset  . Hypertension Mother   . Heart disease Mother   . Diabetes Sister   . Heart disease Sister   . Hypertension Sister   . Heart disease Maternal Aunt   . Cancer Brother   . Prostate cancer Brother     Social History   Socioeconomic History  . Marital status: Married    Spouse name: Not on file  . Number of children: 5  . Years of education: Not on file  . Highest education level: 4th grade  Occupational History  . Not on file  Tobacco Use  . Smoking status: Never Smoker  . Smokeless tobacco: Never Used  Vaping Use  . Vaping Use: Never used  Substance and Sexual Activity  . Alcohol use: No  . Drug use: No  . Sexual activity: Not Currently  Other Topics Concern  . Not on file  Social History Narrative  . Not on file    Social Determinants of Health   Financial Resource Strain:   . Difficulty of Paying Living Expenses:   Food Insecurity:   . Worried About Programme researcher, broadcasting/film/video in the Last Year:   . Barista in the Last Year:   Transportation Needs: No Transportation Needs  . Lack of Transportation (Medical): No  . Lack of Transportation (Non-Medical): No  Physical Activity:   . Days of Exercise per Week:   . Minutes of Exercise per Session:   Stress:   . Feeling of Stress :   Social Connections:   . Frequency of Communication with Friends and Family:   . Frequency of Social Gatherings with Friends and Family:   . Attends Religious Services:   . Active Member of Clubs or Organizations:   . Attends Banker Meetings:   Marland Kitchen Marital Status:   Intimate Partner Violence:   . Fear of Current or Ex-Partner:   . Emotionally Abused:   Marland Kitchen Physically Abused:   . Sexually Abused:     Outpatient Medications Prior to Visit  Medication Sig Dispense Refill  . amLODipine (NORVASC) 10 MG tablet Take 1 tablet (10 mg total) by mouth daily. 30 tablet 5  . atorvastatin (LIPITOR) 40 MG tablet Take 1 tablet (  40 mg total) by mouth daily. 30 tablet 5  . potassium chloride SA (KLOR-CON) 20 MEQ tablet Take 1 tablet (20 mEq total) by mouth daily. 5 tablet 0  . triamterene-hydrochlorothiazide (MAXZIDE) 75-50 MG tablet Take 1 tablet by mouth daily. 30 tablet 5   No facility-administered medications prior to visit.    No Known Allergies  ROS Review of Systems Per hpi     Objective:    Physical Exam Vitals and nursing note reviewed.  Constitutional:      General: She is not in acute distress.    Appearance: Normal appearance. She is obese. She is not ill-appearing, toxic-appearing or diaphoretic.  Cardiovascular:     Rate and Rhythm: Normal rate and regular rhythm.  Pulmonary:     Effort: Pulmonary effort is normal. No respiratory distress.  Skin:    General: Skin is warm and dry.      Capillary Refill: Capillary refill takes 2 to 3 seconds.     Coloration: Skin is mottled. Skin is not ashen, cyanotic, jaundiced, pale or sallow.     Findings: Erythema and rash present. No bruising or lesion. Rash is macular, papular and urticarial.          Comments: Area of rash highlighted.  Red, shiny, mildly ttp Skin feels taught.    Neurological:     General: No focal deficit present.     Mental Status: She is alert and oriented to person, place, and time. Mental status is at baseline.  Psychiatric:        Mood and Affect: Mood normal.        Behavior: Behavior normal.        Thought Content: Thought content normal.        Judgment: Judgment normal.     BP 124/72   Pulse 97   Temp 98.3 F (36.8 C) (Temporal)   Resp 18   Ht 5\' 1"  (1.549 m)   Wt 181 lb 12.8 oz (82.5 kg)   SpO2 97%   BMI 34.35 kg/m  Wt Readings from Last 3 Encounters:  05/08/20 181 lb 12.8 oz (82.5 kg)  04/16/20 182 lb (82.6 kg)  03/19/20 178 lb (80.7 kg)     Health Maintenance Due  Topic Date Due  . PAP SMEAR-Modifier  Never done    There are no preventive care reminders to display for this patient.  No results found for: TSH No results found for: WBC, HGB, HCT, MCV, PLT Lab Results  Component Value Date   NA 139 05/08/2020   K 3.7 05/08/2020   CO2 23 05/08/2020   GLUCOSE 131 (H) 05/08/2020   BUN 17 05/08/2020   CREATININE 0.60 05/08/2020   BILITOT <0.2 04/16/2020   ALKPHOS 125 (H) 04/16/2020   AST 17 04/16/2020   ALT 19 04/16/2020   PROT 6.9 04/16/2020   ALBUMIN 4.0 04/16/2020   CALCIUM 9.5 05/08/2020   Lab Results  Component Value Date   CHOL 146 04/16/2020   Lab Results  Component Value Date   HDL 46 04/16/2020   Lab Results  Component Value Date   LDLCALC 73 04/16/2020   Lab Results  Component Value Date   TRIG 154 (H) 04/16/2020   Lab Results  Component Value Date   CHOLHDL 3.2 04/16/2020   No results found for: HGBA1C    Assessment & Plan:   Problem  List Items Addressed This Visit    None    Visit Diagnoses    Localized swelling of  both lower legs    -  Primary   Relevant Medications   sulfamethoxazole-trimethoprim (BACTRIM) 400-80 MG tablet   Other Relevant Orders   Basic Metabolic Panel (Completed)   VAS [place] LOWER EXTREMITY VENOUS (DVT)   Hypokalemia       Relevant Orders   Basic Metabolic Panel (Completed)      Meds ordered this encounter  Medications  . sulfamethoxazole-trimethoprim (BACTRIM) 400-80 MG tablet    Sig: Take 1 tablet by mouth 2 (two) times daily.    Dispense:  14 tablet    Refill:  0    Order Specific Question:   Supervising Provider    AnswerMyles Lipps [[id number]]    Follow-up: No follow-ups on file.   PLAN  Likely cellulitis but cannot rule out DVT.   Will give course of bactrim, order doppler   Patient encouraged to call clinic with any questions, comments, or concerns.  Janeece Agee, NP

## 2020-05-14 ENCOUNTER — Ambulatory Visit (HOSPITAL_COMMUNITY): Admission: RE | Admit: 2020-05-14 | Payer: Self-pay | Source: Ambulatory Visit

## 2020-05-16 ENCOUNTER — Ambulatory Visit: Payer: Self-pay | Admitting: Emergency Medicine

## 2020-06-05 ENCOUNTER — Ambulatory Visit: Payer: Self-pay | Admitting: Family Medicine

## 2020-10-15 ENCOUNTER — Ambulatory Visit: Payer: Self-pay | Admitting: Family Medicine

## 2020-11-19 IMAGING — MG DIGITAL SCREENING BILAT W/ TOMO W/ CAD
8 series · 8 of 24 positions shown · non-contrast
Comparison: Previous exam(s).

CLINICAL DATA: Screening.

EXAM:
DIGITAL SCREENING BILATERAL MAMMOGRAM WITH TOMO AND CAD

[L MLO synth-2D]
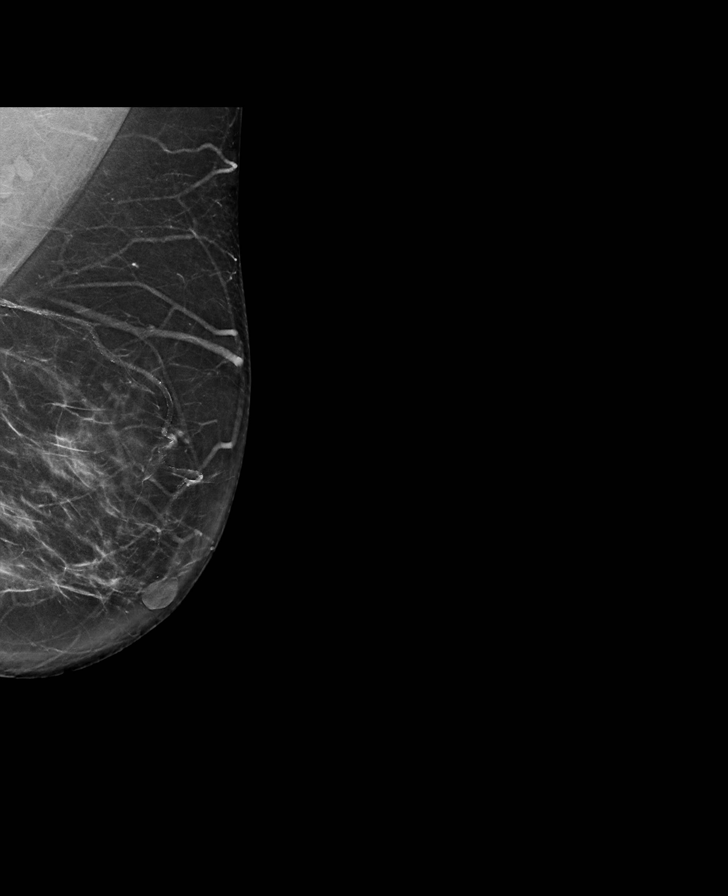

[R MLO synth-2D]
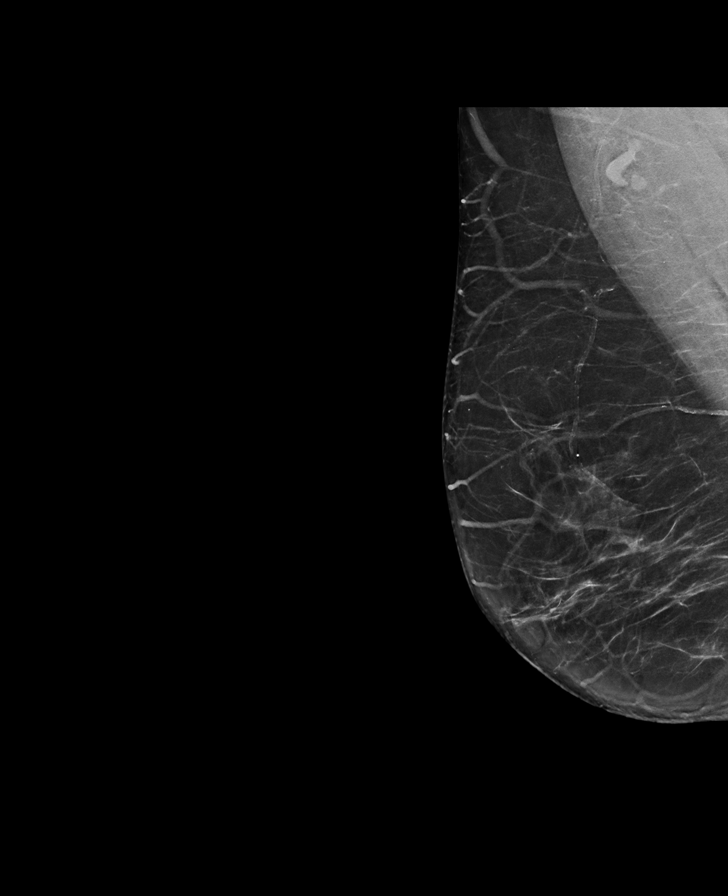

[L CC synth-2D]
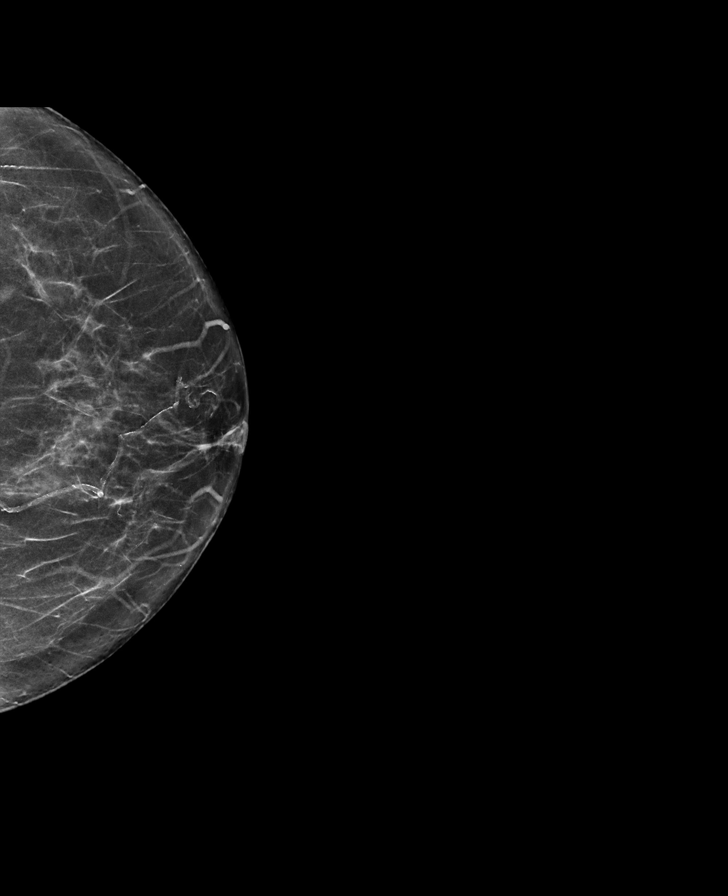

[R CC synth-2D]
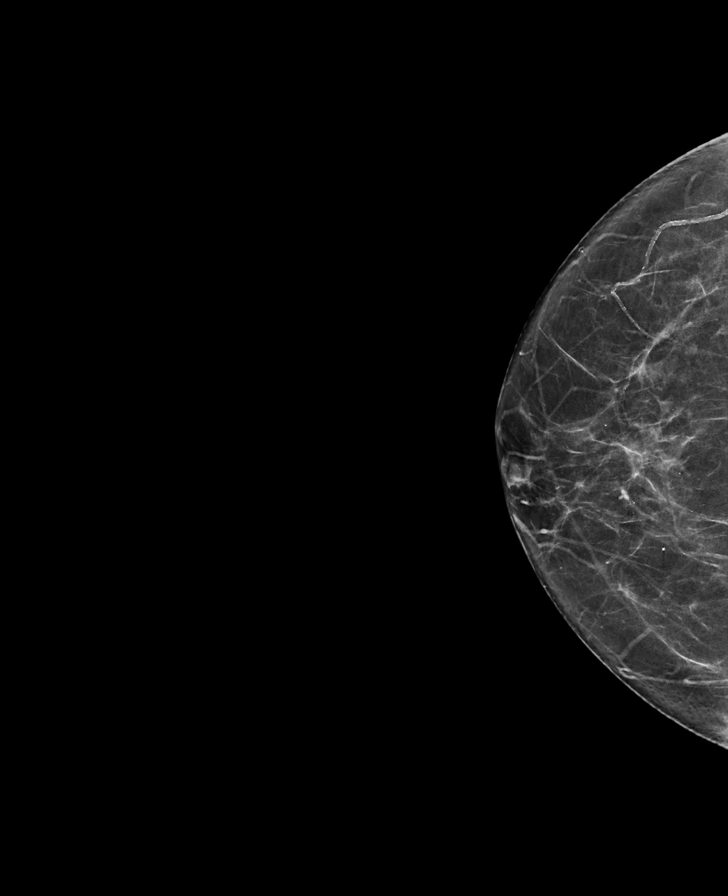

[R CC tomo · tomo slice 32/63.0]
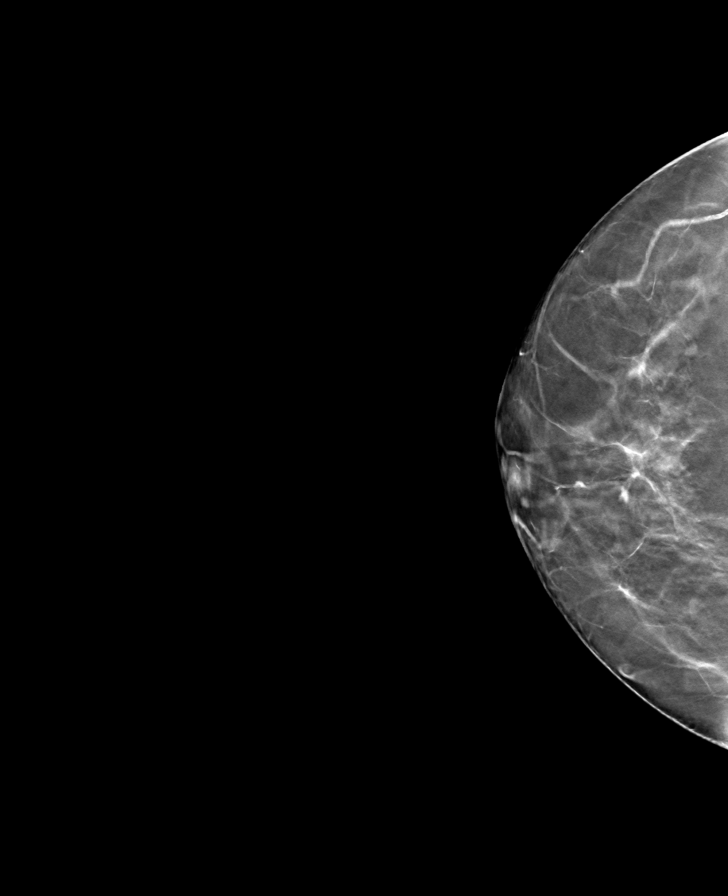

[L CC tomo · tomo slice 33/66.0]
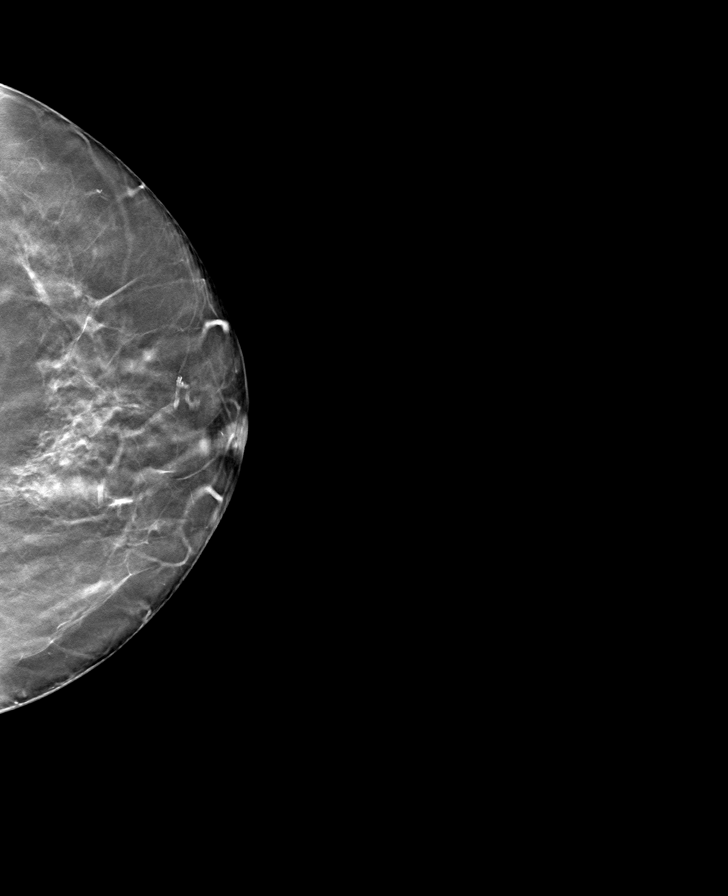

[R MLO tomo · tomo slice 36/71.0]
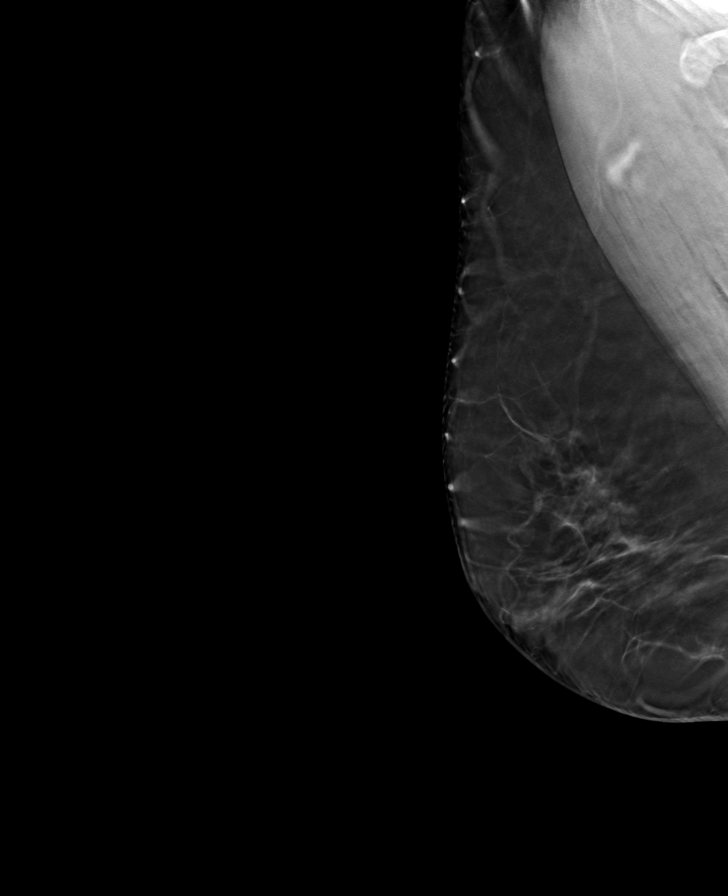

[L MLO tomo · tomo slice 37/73.0]
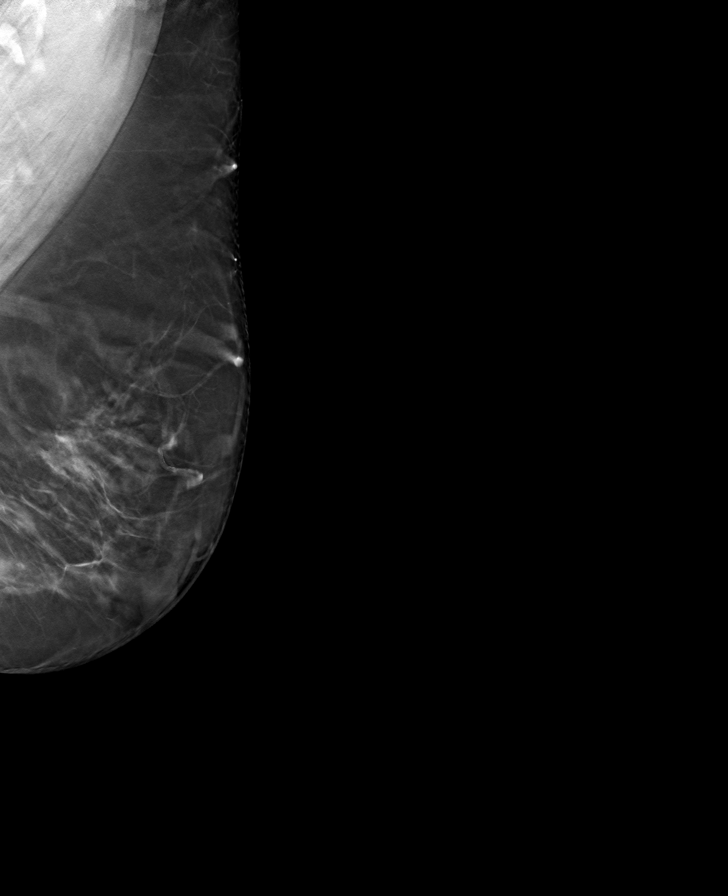

[8 of 24 positions shown; findings below may reference images not displayed]

ACR Breast Density Category b: There are scattered areas of
fibroglandular density.
FINDINGS: There are no findings suspicious for malignancy. Images were
processed with CAD.
IMPRESSION: No mammographic evidence of malignancy. A result letter of this
screening mammogram will be mailed directly to the patient.

RECOMMENDATION:
Screening mammogram in one year. (Code:[id number])

BI-RADS CATEGORY  1: Negative.

## 2021-03-18 ENCOUNTER — Ambulatory Visit
Admission: EM | Admit: 2021-03-18 | Discharge: 2021-03-18 | Disposition: A | Discharge status: Home / Self Care | Payer: No Typology Code available for payment source | Attending: Emergency Medicine | Admitting: Emergency Medicine

## 2021-03-18 ENCOUNTER — Other Ambulatory Visit: Payer: Self-pay

## 2021-03-18 DIAGNOSIS — Z760 Encounter for issue of repeat prescription: Secondary | ICD-10-CM

## 2021-03-18 DIAGNOSIS — I10 Essential (primary) hypertension: Secondary | ICD-10-CM

## 2021-03-18 DIAGNOSIS — E785 Hyperlipidemia, unspecified: Secondary | ICD-10-CM

## 2021-03-18 MED ORDER — AMLODIPINE BESYLATE 10 MG PO TABS: 10.0000 mg | ORAL_TABLET | Freq: Every day | ORAL | 5 refills | Status: AC

## 2021-03-18 MED ORDER — TRIAMTERENE-HCTZ 75-50 MG PO TABS: 1.0000 | ORAL_TABLET | Freq: Every day | ORAL | 5 refills | Status: AC

## 2021-03-18 MED ORDER — ATORVASTATIN CALCIUM 40 MG PO TABS: 40.0000 mg | ORAL_TABLET | Freq: Every day | ORAL | 5 refills | Status: AC

## 2021-03-18 NOTE — Discharge Instructions (Signed)
Toma las medicinas para su presion y cholesterol Llame la clinica para una cita con un nuevo doctor primaria Regrese si necesita

## 2021-03-18 NOTE — ED Triage Notes (Signed)
Pt requesting refill on her amlodipine 10mg , atorvastatin 40mg , and triamterene-hydrochlorothiazide 75-50mg . States goes to the clinic but they are closed. Pt states needs a PCP.

## 2021-03-18 NOTE — ED Provider Notes (Signed)
[facility]    CSN: [id number] Arrival date & time: 03/18/21  0973      History   Chief Complaint Chief Complaint  Patient presents with  . Medication Refill    HPI Erin Boyle is a 60 y.o. female history of hypertension, hyperlipidemia presenting today for evaluation of medication refill.  Patient has been out of her blood pressure and cholesterol medicines for approximately 4 days.  Denies problems with this medicine.  Her primary care recently closed.  Is on amlodipine 10 mg and triamterene/HCTZ combo 75-50 mg.  Denies any problems with these medicines.  Also is on a atorvastatin 40 mg daily.  Needing a new PCP.  Denies headache, vision changes, chest pain or shortness of breath.  HPI  Past Medical History:  Diagnosis Date  . Hyperlipidemia   . Hypertension     Patient Active Problem List   Diagnosis Date Noted  . Screening breast examination 12/08/2019  . Abnormal mammogram 07/21/2016  . Benign essential hypertension 07/28/2015  . Language barrier to communication 07/28/2015  . Non compliance with medical treatment 07/28/2015    Past Surgical History:  Procedure Laterality Date  . TUBAL LIGATION      OB History    Gravida  5   Para      Term      Preterm      AB      Living  5     SAB      IAB      Ectopic      Multiple      Live Births               Home Medications    Prior to Admission medications   Medication Sig Start Date End Date Taking? Authorizing Provider  amLODipine (NORVASC) 10 MG tablet Take 1 tablet (10 mg total) by mouth daily. 03/18/21   Nailea Whitehorn C, PA-C  atorvastatin (LIPITOR) 40 MG tablet Take 1 tablet (40 mg total) by mouth daily. 03/18/21   Maylon Sailors C, PA-C  triamterene-hydrochlorothiazide (MAXZIDE) 75-50 MG tablet Take 1 tablet by mouth daily. 03/18/21   Tatsuo Musial, Junius Creamer, PA-C    Family History Family History  Problem Relation Age of Onset  . Hypertension Mother   . Heart disease  Mother   . Diabetes Sister   . Heart disease Sister   . Hypertension Sister   . Heart disease Maternal Aunt   . Cancer Brother   . Prostate cancer Brother     Social History Social History   Tobacco Use  . Smoking status: Never Smoker  . Smokeless tobacco: Never Used  Vaping Use  . Vaping Use: Never used  Substance Use Topics  . Alcohol use: No  . Drug use: No     Allergies   Patient has no known allergies.   Review of Systems Review of Systems  Constitutional: Negative for fatigue and fever.  HENT: Negative for congestion, sinus pressure and sore throat.   Eyes: Negative for photophobia, pain and visual disturbance.  Respiratory: Negative for cough and shortness of breath.   Cardiovascular: Negative for chest pain.  Gastrointestinal: Negative for abdominal pain, nausea and vomiting.  Genitourinary: Negative for decreased urine volume and hematuria.  Musculoskeletal: Negative for myalgias, neck pain and neck stiffness.  Neurological: Negative for dizziness, syncope, facial asymmetry, speech difficulty, weakness, light-headedness, numbness and headaches.     Physical Exam Triage Vital Signs ED Triage Vitals  Enc Vitals Group  BP      Pulse      Resp      Temp      Temp src      SpO2      Weight      Height      Head Circumference      Peak Flow      Pain Score      Pain Loc      Pain Edu?      Excl. in GC?    No data found.  Updated Vital Signs BP (!) 143/83 (BP Location: Left Arm)   Pulse 87   Temp 97.9 F (36.6 C) (Oral)   Resp 18   SpO2 96%   Visual Acuity Right Eye Distance:   Left Eye Distance:   Bilateral Distance:    Right Eye Near:   Left Eye Near:    Bilateral Near:     Physical Exam Vitals and nursing note reviewed.  Constitutional:      Appearance: She is well-developed.     Comments: No acute distress  HENT:     Head: Normocephalic and atraumatic.     Nose: Nose normal.  Eyes:     Conjunctiva/sclera: Conjunctivae  normal.  Cardiovascular:     Rate and Rhythm: Normal rate and regular rhythm.  Pulmonary:     Effort: Pulmonary effort is normal. No respiratory distress.     Comments: Breathing comfortably at rest, CTABL, no wheezing, rales or other adventitious sounds auscultated Abdominal:     General: There is no distension.  Musculoskeletal:        General: Normal range of motion.     Cervical back: Neck supple.  Skin:    General: Skin is warm and dry.  Neurological:     Mental Status: She is alert and oriented to person, place, and time.      UC Treatments / Results  Labs (all labs ordered are listed, but only abnormal results are displayed) Labs Reviewed - No data to display  EKG   Radiology No results found.  Procedures Procedures (including critical care time)  Medications Ordered in UC Medications - No data to display  Initial Impression / Assessment and Plan / UC Course  I have reviewed the triage vital signs and the nursing notes.  Pertinent labs & imaging results that were available during my care of the patient were reviewed by me and considered in my medical decision making (see chart for details).     Medication refill/hypertension/hyperlipidemia-provided refills of blood pressure medicine and cholesterol medicine, discussed importance of establishing care with new primary care to have further monitoring management of blood pressure and cholesterol as well as to have kidney function and liver function checked regularly while on these medicines.  Contact provided.  Discussed strict return precautions. Patient verbalized understanding and is agreeable with plan.  Final Clinical Impressions(s) / UC Diagnoses   Final diagnoses:  Medication refill  Essential hypertension  Hyperlipidemia, unspecified hyperlipidemia type     Discharge Instructions     Toma las medicinas para su presion y cholesterol Llame la clinica para una cita con un nuevo doctor  primaria Regrese si necesita    ED Prescriptions    Medication Sig Dispense Auth. Provider   amLODipine (NORVASC) 10 MG tablet Take 1 tablet (10 mg total) by mouth daily. 90 tablet Tyra Michelle C, PA-C   atorvastatin (LIPITOR) 40 MG tablet Take 1 tablet (40 mg total) by mouth daily. 90 tablet  Huxley Vanwagoner C, PA-C   triamterene-hydrochlorothiazide (MAXZIDE) 75-50 MG tablet Take 1 tablet by mouth daily. 90 tablet Myda Detwiler, Casanova C, PA-C     PDMP not reviewed this encounter.   Lew Dawes, [place] 03/18/21 1114
